# Patient Record
Sex: Female | Born: 1950 | Race: White | Hispanic: No | Marital: Single | Smoking: Former smoker
Health system: Southern US, Community
[De-identification: ages and names within clinical notes are randomized; demographics above are authoritative.]

## PROBLEM LIST (undated history)

## (undated) DIAGNOSIS — J45909 Unspecified asthma, uncomplicated: Secondary | ICD-10-CM

## (undated) DIAGNOSIS — F329 Major depressive disorder, single episode, unspecified: Secondary | ICD-10-CM

## (undated) DIAGNOSIS — L409 Psoriasis, unspecified: Secondary | ICD-10-CM

## (undated) DIAGNOSIS — E119 Type 2 diabetes mellitus without complications: Secondary | ICD-10-CM

## (undated) DIAGNOSIS — I10 Essential (primary) hypertension: Secondary | ICD-10-CM

## (undated) DIAGNOSIS — R519 Headache, unspecified: Secondary | ICD-10-CM

## (undated) DIAGNOSIS — K08199 Complete loss of teeth due to other specified cause, unspecified class: Secondary | ICD-10-CM

## (undated) DIAGNOSIS — R51 Headache: Secondary | ICD-10-CM

## (undated) DIAGNOSIS — I519 Heart disease, unspecified: Secondary | ICD-10-CM

## (undated) DIAGNOSIS — R32 Unspecified urinary incontinence: Secondary | ICD-10-CM

## (undated) DIAGNOSIS — E785 Hyperlipidemia, unspecified: Secondary | ICD-10-CM

## (undated) DIAGNOSIS — M1990 Unspecified osteoarthritis, unspecified site: Secondary | ICD-10-CM

## (undated) DIAGNOSIS — F4310 Post-traumatic stress disorder, unspecified: Secondary | ICD-10-CM

## (undated) DIAGNOSIS — F32A Depression, unspecified: Secondary | ICD-10-CM

## (undated) DIAGNOSIS — K633 Ulcer of intestine: Secondary | ICD-10-CM

## (undated) DIAGNOSIS — F410 Panic disorder [episodic paroxysmal anxiety] without agoraphobia: Secondary | ICD-10-CM

## (undated) DIAGNOSIS — M8580 Other specified disorders of bone density and structure, unspecified site: Secondary | ICD-10-CM

## (undated) DIAGNOSIS — J439 Emphysema, unspecified: Secondary | ICD-10-CM

## (undated) DIAGNOSIS — C801 Malignant (primary) neoplasm, unspecified: Secondary | ICD-10-CM

## (undated) HISTORY — DX: Headache, unspecified: R51.9

## (undated) HISTORY — PX: CHOLECYSTECTOMY: SHX55

## (undated) HISTORY — DX: Unspecified osteoarthritis, unspecified site: M19.90

## (undated) HISTORY — DX: Hyperlipidemia, unspecified: E78.5

## (undated) HISTORY — DX: Heart disease, unspecified: I51.9

## (undated) HISTORY — DX: Unspecified asthma, uncomplicated: J45.909

## (undated) HISTORY — DX: Malignant (primary) neoplasm, unspecified: C80.1

## (undated) HISTORY — DX: Complete loss of teeth due to other specified cause, unspecified class: K08.199

## (undated) HISTORY — DX: Headache: R51

## (undated) HISTORY — DX: Emphysema, unspecified: J43.9

## (undated) HISTORY — DX: Ulcer of intestine: K63.3

## (undated) HISTORY — DX: Other specified disorders of bone density and structure, unspecified site: M85.80

## (undated) HISTORY — DX: Major depressive disorder, single episode, unspecified: F32.9

## (undated) HISTORY — DX: Panic disorder (episodic paroxysmal anxiety): F41.0

## (undated) HISTORY — DX: Depression, unspecified: F32.A

## (undated) HISTORY — DX: Post-traumatic stress disorder, unspecified: F43.10

## (undated) HISTORY — DX: Psoriasis, unspecified: L40.9

## (undated) HISTORY — DX: Essential (primary) hypertension: I10

## (undated) HISTORY — DX: Type 2 diabetes mellitus without complications: E11.9

## (undated) HISTORY — DX: Unspecified urinary incontinence: R32

---

## 1973-01-20 HISTORY — PX: TUBAL LIGATION: SHX77

## 1998-01-20 HISTORY — PX: EYE SURGERY: SHX253

## 2010-01-20 HISTORY — PX: NECK SURGERY: SHX720

## 2014-01-20 HISTORY — PX: LEG SURGERY: SHX1003

## 2017-01-20 HISTORY — PX: CARDIAC SURGERY: SHX584

## 2017-02-18 ENCOUNTER — Encounter: Payer: Self-pay | Admitting: Family Medicine

## 2017-02-18 LAB — CBC AND DIFFERENTIAL
HCT: 36 (ref 36–46)
Hemoglobin: 11.3 — AB (ref 12.0–16.0)
Platelets: 487 — AB (ref 150–399)
WBC: 6.7

## 2017-03-18 LAB — BASIC METABOLIC PANEL
Creatinine: 1 (ref <=1.1)
Glucose: 158
Potassium: 5 (ref 3.4–5.3)
Sodium: 140 (ref 137–147)

## 2017-03-18 LAB — HEPATIC FUNCTION PANEL
ALT: 19 (ref 7–35)
AST: 20 (ref 13–35)
Alkaline Phosphatase: 108 (ref 25–125)

## 2017-04-30 ENCOUNTER — Encounter: Payer: Self-pay | Admitting: Family Medicine

## 2017-07-16 LAB — HM MAMMOGRAPHY

## 2017-11-24 ENCOUNTER — Ambulatory Visit: Payer: Self-pay | Admitting: Nurse Practitioner

## 2017-11-30 ENCOUNTER — Ambulatory Visit (INDEPENDENT_AMBULATORY_CARE_PROVIDER_SITE_OTHER): Payer: Medicare HMO | Admitting: Nurse Practitioner

## 2017-11-30 ENCOUNTER — Encounter: Payer: Self-pay | Admitting: Nurse Practitioner

## 2017-11-30 ENCOUNTER — Ambulatory Visit (INDEPENDENT_AMBULATORY_CARE_PROVIDER_SITE_OTHER): Payer: Medicare HMO

## 2017-11-30 VITALS — BP 114/68 | HR 75 | Temp 98.1°F | Ht 64.0 in | Wt 180.0 lb

## 2017-11-30 DIAGNOSIS — G8929 Other chronic pain: Secondary | ICD-10-CM | POA: Diagnosis not present

## 2017-11-30 DIAGNOSIS — M25572 Pain in left ankle and joints of left foot: Secondary | ICD-10-CM

## 2017-11-30 DIAGNOSIS — Z951 Presence of aortocoronary bypass graft: Secondary | ICD-10-CM

## 2017-11-30 DIAGNOSIS — R05 Cough: Secondary | ICD-10-CM

## 2017-11-30 DIAGNOSIS — R0602 Shortness of breath: Secondary | ICD-10-CM | POA: Diagnosis not present

## 2017-11-30 DIAGNOSIS — R059 Cough, unspecified: Secondary | ICD-10-CM

## 2017-11-30 DIAGNOSIS — R6889 Other general symptoms and signs: Secondary | ICD-10-CM | POA: Diagnosis not present

## 2017-11-30 MED ORDER — BENZONATATE 100 MG PO CAPS: 100.0000 mg | ORAL_CAPSULE | Freq: Three times a day (TID) | ORAL | 0 refills | Status: DC | PRN

## 2017-11-30 NOTE — Progress Notes (Signed)
Subjective:  Patient ID: Kara Williams, female    DOB: 1950/11/29  Age: 67 y.o. MRN: [id number]  CC: Establish Care (est care/pt is complaining of coughing--tried mucinex,inhaler,coughing med,allergy med and left foot/ankle pain. going on 3 wks. just moved here from [facility] 1 mo ago. )  unaccompanied. Moved [facility] to [place] 48month ago. Lives with daughter at this time. Previous followed by cardiology, pulmonology, pain clinic, pcp, and podiatry.  Cough  This is a new problem. The current episode started 1 to 4 weeks ago. The problem has been unchanged. The cough is non-productive. Associated symptoms include chest pain, nasal congestion, postnasal drip, shortness of breath and wheezing. Pertinent negatives include no chills, fever or sweats. The symptoms are aggravated by cold air. She has tried OTC cough suppressant and a beta-agonist inhaler for the symptoms. The treatment provided no relief. Her past medical history is significant for asthma, bronchitis and COPD.  Ankle Pain   The incident occurred more than 1 week ago. There was no injury mechanism. The pain is present in the left ankle and left heel. The quality of the pain is described as aching. The pain has been constant since onset. Pertinent negatives include no loss of motion, loss of sensation, muscle weakness, numbness or tingling. The symptoms are aggravated by weight bearing. She has tried rest, acetaminophen, NSAIDs and ice for the symptoms.   Hx of COPD:ASTHMA AND BRONCHITIS. TOBACCO USE FOR 27YR IN 1980s.  increased SOB and cough after contact with sick grandchild. Took cipro for 150mg  x 7days per patient. No edema, no PND.  Reports hx of heel spurs.  Reviewed past Medical, Social and Family history today.  Outpatient Medications Prior to Visit  Medication Sig Dispense Refill  . albuterol (PROVENTIL) (2.5 MG/3ML) 0.083% nebulizer solution Take 2.5 mg by nebulization every 6 (six) hours as needed for wheezing or shortness of  breath.    . ALPRAZolam (XANAX) 1 MG tablet Take 1 mg by mouth at bedtime as needed for anxiety.    Marland Kitchen aspirin EC 81 MG tablet Take 81 mg by mouth daily.    Marland Kitchen atorvastatin (LIPITOR) 80 MG tablet     . baclofen (LIORESAL) 10 MG tablet Take 10 mg by mouth 3 (three) times daily.    . Calcium Carb-Cholecalciferol (CALCIUM 1000 + D PO) Take by mouth.    . carvedilol (COREG) 6.25 MG tablet Take 6.25 mg by mouth every 12 (twelve) hours.  1  . Cholecalciferol (VITAMIN D3 PO) Take 1,000 Units by mouth.    . clopidogrel (PLAVIX) 75 MG tablet     . ENTRESTO 24-26 MG Take 1 tablet by mouth 2 (two) times daily.  2  . fluticasone (FLONASE) 50 MCG/ACT nasal spray     . HYDROcodone-acetaminophen (NORCO) 10-325 MG tablet     . Insulin Pen Needle (DROPLET PEN NEEDLES) 32G X 5 MM MISC by Does not apply route. 32 gauge x 5/32    . LANTUS SOLOSTAR 100 UNIT/ML Solostar Pen INJECT 25 UNITS SUBCUTANEOUSLY EVERY 12 HOURS  0  . MYRBETRIQ 25 MG TB24 tablet Take 25 mg by mouth daily.  0  . NOVOLOG FLEXPEN 100 UNIT/ML FlexPen USE AS DIRECTED PER SLIDING SCALE. MAX DAILY DOSE OF 30 UNITS PER DAY  0  . ondansetron (ZOFRAN) 4 MG tablet Take 4 mg by mouth every 8 (eight) hours as needed for nausea or vomiting.    Marland Kitchen PAZEO 0.7 % SOLN     . PENNSAID 2 % SOLN     .  potassium chloride SA (K-DUR,KLOR-CON) 20 MEQ tablet     . pregabalin (LYRICA) 150 MG capsule Take 150 mg by mouth 2 (two) times daily.    . ranitidine (ZANTAC) 150 MG tablet     . montelukast (SINGULAIR) 10 MG tablet     . SYMBICORT 160-4.5 MCG/ACT inhaler     . VENTOLIN HFA 108 (90 Base) MCG/ACT inhaler      No facility-administered medications prior to visit.     ROS See HPI  Objective:  BP 114/68   Pulse 75   Temp 98.1 F (36.7 C) (Oral)   Ht 5\' 4"  (1.626 m)   Wt 180 lb (81.6 kg)   SpO2 95%   BMI 30.90 kg/m   BP Readings from Last 3 Encounters:  11/30/17 114/68    Wt Readings from Last 3 Encounters:  11/30/17 180 lb (81.6 kg)    Physical  Exam  Constitutional: She is oriented to person, place, and time.  HENT:  Mouth/Throat: Oropharynx is clear and moist. No oropharyngeal exudate.  Neck: Normal range of motion. Neck supple. No thyromegaly present.  Cardiovascular: Normal rate and regular rhythm.  Pulmonary/Chest: Effort normal and breath sounds normal. No stridor. No respiratory distress. She has no wheezes. She has no rales.  Musculoskeletal: She exhibits tenderness. She exhibits no edema.       Left ankle: She exhibits normal range of motion, no swelling, no ecchymosis, no deformity and normal pulse. Tenderness. AITFL tenderness found. No lateral malleolus, no medial malleolus, no CF ligament, no posterior TFL, no head of 5th metatarsal and no proximal fibula tenderness found. Achilles tendon normal.  Lymphadenopathy:    She has no cervical adenopathy.  Neurological: She is alert and oriented to person, place, and time.  Skin: Skin is warm and dry.  Psychiatric: She has a normal mood and affect. Her behavior is normal. Thought content normal.  Vitals reviewed.   No results found for: WBC, HGB, HCT, PLT, GLUCOSE, CHOL, TRIG, HDL, LDLDIRECT, LDLCALC, ALT, AST, NA, K, CL, CREATININE, BUN, CO2, TSH, PSA, INR, GLUF, HGBA1C, MICROALBUR  Patient was never admitted.  Assessment & Plan:   Kara Williams was seen today for establish care.  Diagnoses and all orders for this visit:  Cough -     DG Chest 2 View -     benzonatate (TESSALON) 100 MG capsule; Take 1 capsule (100 mg total) by mouth 3 (three) times daily as needed for cough. -     VENTOLIN HFA 108 (90 Base) MCG/ACT inhaler; Inhale 1-2 puffs into the lungs every 6 (six) hours as needed for wheezing or shortness of breath. -     SYMBICORT 160-4.5 MCG/ACT inhaler; Inhale 2 puffs into the lungs 2 (two) times daily. Rinse mouth after each use -     montelukast (SINGULAIR) 10 MG tablet; Take 1 tablet (10 mg total) by mouth at bedtime.  SOB (shortness of breath) -     DG Chest 2  View -     VENTOLIN HFA 108 (90 Base) MCG/ACT inhaler; Inhale 1-2 puffs into the lungs every 6 (six) hours as needed for wheezing or shortness of breath. -     SYMBICORT 160-4.5 MCG/ACT inhaler; Inhale 2 puffs into the lungs 2 (two) times daily. Rinse mouth after each use  Chronic pain of left ankle -     Ambulatory referral to Podiatry  S/P CABG (coronary artery bypass graft) -     Ambulatory referral to Cardiology   I have changed Rodena Piety  Boteler's VENTOLIN HFA, SYMBICORT, and montelukast. I am also having her start on benzonatate. Additionally, I am having her maintain her atorvastatin, carvedilol, clopidogrel, fluticasone, HYDROcodone-acetaminophen, NOVOLOG FLEXPEN, LANTUS SOLOSTAR, PENNSAID, MYRBETRIQ, PAZEO, potassium chloride SA, ranitidine, ENTRESTO, albuterol, ALPRAZolam, aspirin EC, Calcium Carb-Cholecalciferol (CALCIUM 1000 + D PO), pregabalin, ondansetron, Cholecalciferol (VITAMIN D3 PO), Insulin Pen Needle, and baclofen.  Meds ordered this encounter  Medications  . benzonatate (TESSALON) 100 MG capsule    Sig: Take 1 capsule (100 mg total) by mouth 3 (three) times daily as needed for cough.    Dispense:  20 capsule    Refill:  0    Order Specific Question:   Supervising Provider    Answer:   Lucille Passy [[id number]]  . VENTOLIN HFA 108 (90 Base) MCG/ACT inhaler    Sig: Inhale 1-2 puffs into the lungs every 6 (six) hours as needed for wheezing or shortness of breath.    Dispense:  1 Inhaler    Refill:  1    Order Specific Question:   Supervising Provider    Answer:   MATTHEWS, CODY [[id number]]  . SYMBICORT 160-4.5 MCG/ACT inhaler    Sig: Inhale 2 puffs into the lungs 2 (two) times daily. Rinse mouth after each use    Dispense:  1 Inhaler    Refill:  1    Order Specific Question:   Supervising Provider    Answer:   MATTHEWS, CODY [[id number]]  . montelukast (SINGULAIR) 10 MG tablet    Sig: Take 1 tablet (10 mg total) by mouth at bedtime.    Dispense:  30 tablet    Refill:  0    Order  Specific Question:   Supervising Provider    Answer:   Lucille Passy [[id number]]    Follow-up: Return in about 3 days (around 12/03/2017) for medication reconciliation (30mins).  Wilfred Lacy, NP

## 2017-11-30 NOTE — Patient Instructions (Addendum)
Please sign medical release to get records from previous pcp, endocrinology and cardiology.  Will review records prior to making any additional recommendations  Bring medication bottle within this week to help verify medications and dosage.  CXR: no acute findings. Resume inhalers. Declined labs today.

## 2017-12-01 MED ORDER — VENTOLIN HFA 108 (90 BASE) MCG/ACT IN AERS: 1.0000 | INHALATION_SPRAY | Freq: Four times a day (QID) | RESPIRATORY_TRACT | 1 refills | Status: DC | PRN

## 2017-12-01 MED ORDER — SYMBICORT 160-4.5 MCG/ACT IN AERO: 2.0000 | INHALATION_SPRAY | Freq: Two times a day (BID) | RESPIRATORY_TRACT | 1 refills | Status: DC

## 2017-12-02 ENCOUNTER — Telehealth: Payer: Self-pay | Admitting: Nurse Practitioner

## 2017-12-02 ENCOUNTER — Encounter: Payer: Self-pay | Admitting: Nurse Practitioner

## 2017-12-02 MED ORDER — MONTELUKAST SODIUM 10 MG PO TABS: 10.0000 mg | ORAL_TABLET | Freq: Every day | ORAL | 0 refills | Status: DC

## 2017-12-02 NOTE — Telephone Encounter (Signed)
Sure. Let her know I will not px her norco long term and will need to get her set up with pain clinic here as wel.

## 2017-12-02 NOTE — Telephone Encounter (Signed)
Admin team please call the pt and offer an appt with new PCP (along with Dr. Rogers Blocker message).

## 2017-12-02 NOTE — Telephone Encounter (Signed)
Contact patient to schedule TOC appointment and per Dr. Rogers Blocker "Let her know she will not prescribe her norco long term and will need to get her set up with pain clinic as well." Document in this note once you speak to patient and note if she agrees to prescription agreement.

## 2017-12-02 NOTE — Telephone Encounter (Signed)
Both provider please advise.

## 2017-12-02 NOTE — Telephone Encounter (Signed)
Copied from [place] [id number]. Topic: General - Other >> Dec 02, 2017  1:03 PM Kara Williams, NT wrote: Reason for CRM: Patient needs to change from [place] to Dr Rogers Blocker for insurance purposes please call her to schedule at [phone]

## 2017-12-02 NOTE — Telephone Encounter (Signed)
ok 

## 2017-12-04 ENCOUNTER — Ambulatory Visit: Payer: Medicare HMO | Admitting: Nurse Practitioner

## 2017-12-05 DIAGNOSIS — J449 Chronic obstructive pulmonary disease, unspecified: Secondary | ICD-10-CM | POA: Insufficient documentation

## 2017-12-07 ENCOUNTER — Ambulatory Visit (INDEPENDENT_AMBULATORY_CARE_PROVIDER_SITE_OTHER): Payer: Medicare HMO

## 2017-12-07 ENCOUNTER — Encounter: Payer: Self-pay | Admitting: Podiatry

## 2017-12-07 ENCOUNTER — Other Ambulatory Visit: Payer: Self-pay | Admitting: Podiatry

## 2017-12-07 ENCOUNTER — Ambulatory Visit (INDEPENDENT_AMBULATORY_CARE_PROVIDER_SITE_OTHER): Payer: Medicare HMO | Admitting: Podiatry

## 2017-12-07 VITALS — BP 99/64 | HR 83 | Resp 16

## 2017-12-07 DIAGNOSIS — M779 Enthesopathy, unspecified: Secondary | ICD-10-CM

## 2017-12-07 DIAGNOSIS — R6889 Other general symptoms and signs: Secondary | ICD-10-CM | POA: Diagnosis not present

## 2017-12-07 DIAGNOSIS — M25572 Pain in left ankle and joints of left foot: Secondary | ICD-10-CM | POA: Diagnosis not present

## 2017-12-07 NOTE — Progress Notes (Signed)
   Subjective:    Patient ID: Kara Williams, female    DOB: 1950-04-04, 67 y.o.   MRN: [id number]  HPI    Review of Systems  Respiratory: Positive for shortness of breath.   All other systems reviewed and are negative.      Objective:   Physical Exam        Assessment & Plan:

## 2017-12-08 DIAGNOSIS — R6889 Other general symptoms and signs: Secondary | ICD-10-CM | POA: Diagnosis not present

## 2017-12-08 NOTE — Progress Notes (Signed)
Subjective:   Patient ID: Kara Williams, female   DOB: 67 y.o.   MRN: [id number]   HPI Patient presents stating she is having a lot of pain in the inside of her left ankle and its been hurting around a month.  Also states she is allergic to steroids and cannot take it is having trouble bearing weight on her foot.  Patient does not smoke currently and likes to be active and A1c has been around 7   Review of Systems  All other systems reviewed and are negative.       Objective:  Physical Exam  Constitutional: She appears well-developed and well-nourished.  Cardiovascular: Intact distal pulses.  Pulmonary/Chest: Effort normal.  Musculoskeletal: Normal range of motion.  Neurological: She is alert.  Skin: Skin is warm.  Nursing note and vitals reviewed.   Neurovascular status found to be intact with patient noted to have inflammation pain of the left medial ankle with moderate depression of the arch noted and patient was noted to have good digital perfusion and is well oriented x3     Assessment:  Acute posterior tibial tendinitis left with patient having inflammation and pain and inability to wear shoe gear     Plan:  H&P x-ray reviewed and today I did go ahead and I immobilized with air fracture walker to completely let it rest and she stated this gave her great relief.  I did discuss that ultimately this may require other treatment or MRI but hopefully it will respond to this conservative treatment  X-ray was negative for fracture or indications of bony arthritic condition

## 2017-12-10 ENCOUNTER — Encounter: Payer: Self-pay | Admitting: Family Medicine

## 2017-12-10 ENCOUNTER — Ambulatory Visit (INDEPENDENT_AMBULATORY_CARE_PROVIDER_SITE_OTHER): Payer: Medicare HMO | Admitting: Family Medicine

## 2017-12-10 VITALS — BP 124/82 | HR 76 | Temp 98.2°F | Ht 64.0 in | Wt 174.0 lb

## 2017-12-10 DIAGNOSIS — R05 Cough: Secondary | ICD-10-CM | POA: Diagnosis not present

## 2017-12-10 DIAGNOSIS — I10 Essential (primary) hypertension: Secondary | ICD-10-CM

## 2017-12-10 DIAGNOSIS — I509 Heart failure, unspecified: Secondary | ICD-10-CM | POA: Insufficient documentation

## 2017-12-10 DIAGNOSIS — I2510 Atherosclerotic heart disease of native coronary artery without angina pectoris: Secondary | ICD-10-CM | POA: Diagnosis not present

## 2017-12-10 DIAGNOSIS — I5022 Chronic systolic (congestive) heart failure: Secondary | ICD-10-CM | POA: Diagnosis not present

## 2017-12-10 DIAGNOSIS — F4310 Post-traumatic stress disorder, unspecified: Secondary | ICD-10-CM | POA: Diagnosis not present

## 2017-12-10 DIAGNOSIS — E119 Type 2 diabetes mellitus without complications: Secondary | ICD-10-CM

## 2017-12-10 DIAGNOSIS — R6889 Other general symptoms and signs: Secondary | ICD-10-CM | POA: Diagnosis not present

## 2017-12-10 DIAGNOSIS — M5030 Other cervical disc degeneration, unspecified cervical region: Secondary | ICD-10-CM

## 2017-12-10 DIAGNOSIS — R059 Cough, unspecified: Secondary | ICD-10-CM

## 2017-12-10 DIAGNOSIS — E785 Hyperlipidemia, unspecified: Secondary | ICD-10-CM | POA: Insufficient documentation

## 2017-12-10 MED ORDER — MONTELUKAST SODIUM 10 MG PO TABS: 10.0000 mg | ORAL_TABLET | Freq: Every day | ORAL | 0 refills | Status: DC

## 2017-12-10 MED ORDER — CARVEDILOL 6.25 MG PO TABS: 6.2500 mg | ORAL_TABLET | Freq: Two times a day (BID) | ORAL | 0 refills | Status: DC

## 2017-12-10 MED ORDER — ENTRESTO 24-26 MG PO TABS: 1.0000 | ORAL_TABLET | Freq: Two times a day (BID) | ORAL | 0 refills | Status: DC

## 2017-12-10 MED ORDER — ALPRAZOLAM 1 MG PO TABS: 1.0000 mg | ORAL_TABLET | Freq: Every evening | ORAL | 0 refills | Status: AC | PRN

## 2017-12-10 MED ORDER — POTASSIUM CHLORIDE CRYS ER 20 MEQ PO TBCR: 20.0000 meq | EXTENDED_RELEASE_TABLET | Freq: Every day | ORAL | 0 refills | Status: DC

## 2017-12-10 MED ORDER — ATORVASTATIN CALCIUM 80 MG PO TABS: 80.0000 mg | ORAL_TABLET | Freq: Every day | ORAL | 0 refills | Status: DC

## 2017-12-10 MED ORDER — SYMBICORT 160-4.5 MCG/ACT IN AERO: 2.0000 | INHALATION_SPRAY | Freq: Two times a day (BID) | RESPIRATORY_TRACT | 3 refills | Status: AC

## 2017-12-10 MED ORDER — MYRBETRIQ 25 MG PO TB24: 25.0000 mg | ORAL_TABLET | Freq: Every day | ORAL | 0 refills | Status: DC

## 2017-12-10 MED ORDER — CLOPIDOGREL BISULFATE 75 MG PO TABS: 75.0000 mg | ORAL_TABLET | Freq: Every day | ORAL | 0 refills | Status: DC

## 2017-12-10 MED ORDER — FLUTICASONE PROPIONATE 50 MCG/ACT NA SUSP: 1.0000 | Freq: Every day | NASAL | 3 refills | Status: DC

## 2017-12-10 MED ORDER — VENTOLIN HFA 108 (90 BASE) MCG/ACT IN AERS: 1.0000 | INHALATION_SPRAY | Freq: Four times a day (QID) | RESPIRATORY_TRACT | 1 refills | Status: DC | PRN

## 2017-12-10 MED ORDER — CALCIUM CARB-CHOLECALCIFEROL 1000-800 MG-UNIT PO TABS: 1.0000 | ORAL_TABLET | Freq: Two times a day (BID) | ORAL | 0 refills | Status: DC

## 2017-12-10 MED ORDER — PREGABALIN 150 MG PO CAPS: 150.0000 mg | ORAL_CAPSULE | Freq: Two times a day (BID) | ORAL | 1 refills | Status: DC

## 2017-12-10 MED ORDER — BACLOFEN 10 MG PO TABS: 10.0000 mg | ORAL_TABLET | Freq: Two times a day (BID) | ORAL | 0 refills | Status: DC

## 2017-12-10 NOTE — Progress Notes (Addendum)
Patient: Kara Williams MRN: [id number] DOB: September 06, 1950 PCP: Orma Flaming, MD     Subjective:  Chief Complaint  Patient presents with  . Coronary Artery Disease  . Hyperlipidemia  . Hypertension  . COPD  . Diabetes    HPI: The patient is a 67 y.o. female who presents today for establishing care. She has multiple co morbidities.   Hypertension: Here for follow up of hypertension.  Currently on entresto and coreg for CHF/CAD. Takes medication as prescribed and denies any side effects. Exercise includes nothing.. Weight has been stable. Denies any chest pain, headaches, shortness of breath, vision changes, swelling in lower extremities. She only has 2 pills left of her entresto.   Diabetes: Patient is here for follow up of type 2 diabetes. Currently on the following medications lantus and sliding scale insulin. Takes medications as prescribed. Not currently exercising and attempts to follow diabetic diet. Sugars range from 103  to 160. Denies any hypoglycemic events. Denies any vision changes, nausea, vomiting, abdominal pain, ulcers/paraesthesia in feet, polyuria, polydipsia or polyphagia. Denies any chest pain, shortness of breath. She tells me they stopped all oral medication when she had her MI and put her on insulin.   CAD: MI in 01/2017 in [place]. Cath---triple bypass. CHF secondary to MI. Currently on DPT with ASA/plavix, entresto, atrovastatin and coreg. Last echo was in June with EF of 39%. Needs to establish with cardiology here. Only has 2 pills of entresto left. Also requesting records so cardiology has last echo/cath/surgical report. No chest pain or anginal type symptoms. Takes medication as prescribed.   CHF: see above.   Degenerative disc disease: has hx of cervical issues with surgery and disc issues. Takes muscle relaxer daily with help for this and on narcotic (norco) followed by pain management in [place]. Needing a refill of her pain medication.   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 No other medication for this.   Review of Systems  Constitutional: Positive for fatigue. Negative for chills and fever.  HENT: Negative for congestion, dental problem, ear pain, hearing loss and trouble swallowing.   Eyes: Negative for visual disturbance.  Respiratory: Positive for cough (chronic ) and shortness of breath. Negative for chest tightness.   Cardiovascular: Negative for chest pain, palpitations and leg swelling.  Gastrointestinal: Negative for abdominal pain, blood in stool, diarrhea, nausea and vomiting.  Endocrine: Negative for cold intolerance, polydipsia, polyphagia and polyuria.  Genitourinary: Negative for dysuria and hematuria.  Musculoskeletal: Positive for back pain and neck pain. Negative for arthralgias.  Skin: Negative.  Negative for rash.  Neurological: Negative for dizziness, light-headedness and headaches.  Psychiatric/Behavioral: Positive for sleep disturbance. Negative for dysphoric mood. The patient is not nervous/anxious.     Allergies Patient is allergic to erythromycin; penicillins; sulfa antibiotics; lactose intolerance (gi); cortisone; and tetracyclines & related.  Past Medical History Patient  has a past medical history of Arthritis, Asthma, Cancer ([place]), Colon ulcer, Depression, Diabetes mellitus without complication ([place]), Emphysema of lung ([place]), Frequent headaches, Heart disease, Hyperlipidemia, Hypertension, Osteoarthritis, Osteopenia, Panic attack, Psoriasis, PTSD (post-traumatic stress disorder), and Urine incontinence.  Surgical History Patient  has a past surgical history that includes Tubal ligation (1975); Cholecystectomy; Neck surgery (2012); Leg Surgery (2016); Cardiac surgery (2019); and Eye surgery (2000).  Family History Pateint's family history includes Alcohol abuse in her father and mother; Arthritis in her father, mother, and sister; Asthma in her maternal grandfather, maternal  grandmother, paternal grandfather, paternal grandmother, and sister;  Bipolar disorder in her father, mother, and sister; COPD in her father, maternal grandfather, maternal grandmother, mother, paternal grandfather, paternal grandmother, and sister; Depression in her father, mother, and sister; Diabetes in her maternal grandfather, maternal grandmother, paternal grandfather, paternal grandmother, and sister; Drug abuse in her father and mother; Early death in her father and mother; Heart attack in her father, maternal grandfather, maternal grandmother, mother, paternal grandfather, paternal grandmother, and sister; Heart disease in her father, maternal grandfather, maternal grandmother, mother, paternal grandfather, paternal grandmother, and sister; Hyperlipidemia in her father, maternal grandfather, maternal grandmother, mother, paternal grandfather, paternal grandmother, and sister; Hypertension in her father, maternal grandfather, maternal grandmother, mother, paternal grandfather, paternal grandmother, and sister; Miscarriages / [place] in her mother.  Social History Patient  reports that she quit smoking about 39 years ago. She has never used smokeless tobacco. She reports that she does not drink alcohol or use drugs.    Objective: Vitals:   12/10/17 1311  BP: 124/82  Pulse: 76  Temp: 98.2 F (36.8 C)  TempSrc: Oral  SpO2: 96%  Weight: 174 lb (78.9 kg)  Height: 5\' 4"  (1.626 m)    Body mass index is 29.87 kg/m.  Physical Exam  Constitutional: She is oriented to person, place, and time. She appears well-developed and well-nourished.  HENT:  Right Ear: External ear normal.  Left Ear: External ear normal.  Mouth/Throat: Oropharynx is clear and moist.  Tm pearly with light reflex bilaterally    Eyes: Pupils are equal, round, and reactive to light. Conjunctivae and EOM are normal.  Neck: Normal range of motion. Neck supple. No thyromegaly present.  Cardiovascular: Normal rate,  regular rhythm, normal heart sounds and intact distal pulses.  No murmur heard. Pulmonary/Chest: Effort normal and breath sounds normal. She has no wheezes. She has no rales.  Abdominal: Soft. Bowel sounds are normal. She exhibits no distension. There is no tenderness.  Lymphadenopathy:    She has no cervical adenopathy.  Neurological: She is alert and oriented to person, place, and time. She displays normal reflexes. No cranial nerve deficit. Coordination normal.  Skin: Skin is warm and dry. Capillary refill takes less than 2 seconds. No rash noted.  Psychiatric: She has a normal mood and affect. Her behavior is normal.  Vitals reviewed.      Assessment/plan: 1. Benign essential HTN Blood pressure is to goal. Continue medication. Refills given. I need lab work today before giving all of her refills. She has PTSD with needles and can not do this today. She states she has to build up to get labs and likes to get to know people. Also will have to take a xanax prior to this. Discussed with her I have to get labs to continue refills. It is for her safety and if Im prescribing I need to make sure it's safe to give everything. She understands this and will be back within 4 weeks to do her labs.   2. Coronary artery disease involving native coronary artery of native heart without angina pectoris Requesting records of cardiology notes. Refilled entresto and all other drugs. Again, needs labs. Referred to cards.  - Ambulatory referral to Cardiology  3. Chronic systolic congestive heart failure ([place])  - Ambulatory referral to Cardiology  4. Degenerative disc disease, cervical Refilled baclofen. Will not do long term narcotics especially with px of xanax. Will refer to pain management.  - Ambulatory referral to Pain Clinic  5. PTSD (post-traumatic stress disorder) Will do drug contract at next visit.  Only uses about 3/month for blood draws. Sent in 10 pills for her.   6. Diabetes -all oral  medication stopped with MI. Likely due to acute CHF. Need a1c. No hypo events on insulin. Again, will come back and do labs in next month. No refills needed of her insulin. Will discuss with cards if we can change back to oral medication. Would benefit from jardiance.    **MUST HAVE LABS in ONE MONTH BEFORE REFILL OF MEDICATION   " I spent greater than 60 min face -to face with patient, and greater than 50% was spent counseling and/or coordinating care including getting records, reconciling all of her medications and refilling everything, discussing every complex medical issue which she has multiple of and referring to specialists.   Return in about 1 month (around 01/09/2018) for establish care part 2.     Orma Flaming, MD [place]  12/10/2017

## 2017-12-15 DIAGNOSIS — R6889 Other general symptoms and signs: Secondary | ICD-10-CM | POA: Diagnosis not present

## 2017-12-22 DIAGNOSIS — R6889 Other general symptoms and signs: Secondary | ICD-10-CM | POA: Diagnosis not present

## 2017-12-24 ENCOUNTER — Ambulatory Visit: Payer: Self-pay

## 2017-12-24 NOTE — Telephone Encounter (Signed)
Called and spoke with patient.  States that she has an appt scheduled tomorrow 12/6 at 2:20pm with Dr. Rogers Blocker.  Advised pt to continue symptomatic treatment until her appt tomorrow.  She verbalized understanding.

## 2017-12-24 NOTE — Telephone Encounter (Signed)
Pt c/o chills, runny nose, fatigue, fever to 101 this morning, fatigue, nausea and nonproductive cough. Symptoms started yesterday. Pt stated that her ribs are sore from coughing. Pt denies shortness of breath or wheezing. Pt has been taking regular strength Tylenol for the fever. Pt is tolerating fluids and is voiding.  Pt refused flu vaccine due to a needle phobia. Pt has not had any known flu exposure. Pt has many risk factors : COPD, triple bypass heart surgery in January, asthma and a diabetic. Pt stated that she has no transportation to come in for an OV until next Wednesday. Pt is asking for something to be called in to her pharmacy.  Called PCP office and updated [place]. [name] stated that pt could call a taxi to bring her back and forth from the office. Pt stated that she has no money for a taxi. Routing back to office high priority. Reason for Disposition . [1] Fever > 101 F (38.3 C) AND [2] age > 68  Answer Assessment - Initial Assessment Questions 1. WORST SYMPTOM: "What is your worst symptom?" (e.g., cough, runny nose, muscle aches, headache, sore throat, fever)      Chills and nausea, fever 101 this morning, fatigue, cough 2. ONSET: "When did your flu symptoms start?"      yesterday 3. COUGH: "How bad is the cough?"       nonproductive cough that has her ribs sore 4. RESPIRATORY DISTRESS: "Describe your breathing."      Using nebulizer and inhalers 5. FEVER: "Do you have a fever?" If so, ask: "What is your temperature, how was it measured, and when did it start?"     Yes- 101- by mouth 6. EXPOSURE: "Were you exposed to someone with influenza?"       no 7. FLU VACCINE: "Did you get a flu shot this year?"     no 8. HIGH RISK DISEASE: "Do you any chronic medical problems?" (e.g., heart or lung disease, asthma, weak immune system, or other HIGH RISK conditions)     COPD, triple heart bypass in January, asthma, diabetic 9. PREGNANCY: "Is there any chance you are pregnant?" "When was  your last menstrual period?"     n/a 10. OTHER SYMPTOMS: "Do you have any other symptoms?"  (e.g., runny nose, muscle aches, headache, sore throat)       Runny throat  Protocols used: INFLUENZA - SEASONAL-A-AH

## 2017-12-25 ENCOUNTER — Encounter: Payer: Self-pay | Admitting: Family Medicine

## 2017-12-25 ENCOUNTER — Telehealth: Payer: Self-pay | Admitting: Family Medicine

## 2017-12-25 ENCOUNTER — Ambulatory Visit (INDEPENDENT_AMBULATORY_CARE_PROVIDER_SITE_OTHER): Payer: Medicare HMO | Admitting: Family Medicine

## 2017-12-25 VITALS — BP 122/86 | HR 102 | Temp 98.0°F | Ht 64.0 in | Wt 170.4 lb

## 2017-12-25 DIAGNOSIS — R6889 Other general symptoms and signs: Secondary | ICD-10-CM

## 2017-12-25 DIAGNOSIS — R112 Nausea with vomiting, unspecified: Secondary | ICD-10-CM

## 2017-12-25 LAB — GLUCOSE, POCT (MANUAL RESULT ENTRY): POC GLUCOSE: 174 mg/dL — AB (ref 70–99)

## 2017-12-25 MED ORDER — ONDANSETRON 4 MG PO TBDP: 4.0000 mg | ORAL_TABLET | Freq: Three times a day (TID) | ORAL | 0 refills | Status: AC | PRN

## 2017-12-25 MED ORDER — OSELTAMIVIR PHOSPHATE 75 MG PO CAPS: 75.0000 mg | ORAL_CAPSULE | Freq: Two times a day (BID) | ORAL | 0 refills | Status: DC

## 2017-12-25 MED ORDER — GLUCOSE BLOOD VI STRP: ORAL_STRIP | 12 refills | Status: DC

## 2017-12-25 NOTE — Progress Notes (Signed)
Patient: Kara Williams MRN: [id number] DOB: [birth year] PCP: Orma Flaming, MD     Subjective:  Chief Complaint  Patient presents with  . flu like symptoms    x 3 days    HPI: The patient is a 67 y.o. female who presents today for fever, chills, cough, runny nose x 3 days. She is not achy, but her ribs hurt from coughing. She has taken corcedin HB, her inhalers and tylenol. She has only been able to keep down gingerale. She has been vomiting as well. She vomited all day for 2 days and hasn't vomited today. She feels a little bit better today, but not much. Her cough is dry in nature. Fever to 101 yesterday, no fever today. She has no sick contacts. She has some shortness of breath and wheezing. Her inhalers/nebs are helping and doesn't feel like COPD exacerbation. Took sugar pill before she came because felt like her blood sugar was low. Ran out of lancets so not checking her sugars.   Review of Systems  Constitutional: Positive for chills, diaphoresis, fatigue and fever.  HENT: Positive for ear pain, postnasal drip and rhinorrhea. Negative for sinus pressure and sinus pain.        Left ear feels full  Respiratory: Positive for cough and shortness of breath.   Cardiovascular: Positive for chest pain.       Chest hurts w/cough  Gastrointestinal: Positive for nausea and vomiting. Negative for abdominal pain, constipation and diarrhea.  Musculoskeletal: Positive for back pain and myalgias. Negative for neck pain.       Feels achey all over  Neurological: Negative for dizziness and headaches.  Psychiatric/Behavioral: Positive for sleep disturbance.    Allergies Patient is allergic to erythromycin; penicillins; sulfa antibiotics; lactose intolerance (gi); cortisone; and tetracyclines & related.  Past Medical History Patient  has a past medical history of Arthritis, Asthma, Cancer ([place]), Colon ulcer, Depression, Diabetes mellitus without complication ([place]), Emphysema of lung ([place]),  Frequent headaches, Heart disease, Hyperlipidemia, Hypertension, Osteoarthritis, Osteopenia, Panic attack, Psoriasis, PTSD (post-traumatic stress disorder), and Urine incontinence.  Surgical History Patient  has a past surgical history that includes Tubal ligation (1975); Cholecystectomy; Neck surgery (2012); Leg Surgery (2016); Cardiac surgery (2019); and Eye surgery (2000).  Family History Pateint's family history includes Alcohol abuse in her father and mother; Arthritis in her father, mother, and sister; Asthma in her maternal grandfather, maternal grandmother, paternal grandfather, paternal grandmother, and sister; Bipolar disorder in her father, mother, and sister; COPD in her father, maternal grandfather, maternal grandmother, mother, paternal grandfather, paternal grandmother, and sister; Depression in her father, mother, and sister; Diabetes in her maternal grandfather, maternal grandmother, paternal grandfather, paternal grandmother, and sister; Drug abuse in her father and mother; Early death in her father and mother; Heart attack in her father, maternal grandfather, maternal grandmother, mother, paternal grandfather, paternal grandmother, and sister; Heart disease in her father, maternal grandfather, maternal grandmother, mother, paternal grandfather, paternal grandmother, and sister; Hyperlipidemia in her father, maternal grandfather, maternal grandmother, mother, paternal grandfather, paternal grandmother, and sister; Hypertension in her father, maternal grandfather, maternal grandmother, mother, paternal grandfather, paternal grandmother, and sister; Miscarriages / [place] in her mother.  Social History Patient  reports that she quit smoking about 39 years ago. She has never used smokeless tobacco. She reports that she does not drink alcohol or use drugs.    Objective: Vitals:   12/25/17 1409  BP: 122/86  Pulse: (!) 102  Temp: 98 F (36.7 C)  TempSrc: Oral  SpO2:  97%  Weight:  170 lb 6.4 oz (77.3 kg)  Height: 5\' 4"  (1.626 m)    Body mass index is 29.25 kg/m.  Physical Exam  Constitutional: She appears well-developed and well-nourished.  HENT:  Right Ear: External ear normal.  Left Ear: External ear normal.  Mouth/Throat: Oropharynx is clear and moist. No oropharyngeal exudate.  Tm pearly with light reflex bilaterally  Mucous membranes moist   Neck: Normal range of motion. Neck supple. No thyromegaly present.  Cardiovascular: Normal rate, regular rhythm, normal heart sounds and intact distal pulses.  Pulmonary/Chest: Effort normal and breath sounds normal. She has no wheezes. She has no rales.  Abdominal: Soft. Bowel sounds are normal.  Lymphadenopathy:    She has no cervical adenopathy.  Skin: Capillary refill takes less than 2 seconds.  Some skin tenting  Vitals reviewed.     flu: negative  Glucose: 174  Assessment/plan:  1. Flu-like symptoms She is past 48 hours, but still feeling really bad. Will go ahead and do tamiflu even though flu test negative. She looks very flu like to me. Push fluids, zofran for nausea and discussed supportive therapy. Dehydration precautions given.   2. Intractable vomiting with nausea, unspecified vomiting type zofran prn. She has not thrown up today and is feeling slightly better. Continue to push fluids. Discussed I don't want her doing insulin as she is not eating. Can start doing insulin again after she starts eating again. Very strict dehydration precautions given. If still vomiting after 24 hours and unable to keep food down she is to go to ER. Vitals all stable here.  - POCT glucose (manual entry)     Return if symptoms worsen or fail to improve.   Orma Flaming, MD [place]   12/25/2017

## 2017-12-25 NOTE — Telephone Encounter (Signed)
Copied from [place] [id number]. Topic: Quick Communication - See Telephone Encounter >> Dec 25, 2017  4:13 PM Antonieta Iba C wrote: CRM for notification. See Telephone encounter for: 12/25/17.  Pt received a Rx for glucose blood test strip, pt's pharmacy is stating that they don't carry that type of test strips. Per pt, pharmacy is requesting to have a new meter and strips sent.

## 2017-12-28 ENCOUNTER — Other Ambulatory Visit: Payer: Self-pay

## 2017-12-28 ENCOUNTER — Encounter: Payer: Self-pay | Admitting: Family Medicine

## 2017-12-28 MED ORDER — BLOOD GLUCOSE METER KIT: PACK | 0 refills | Status: AC

## 2017-12-31 ENCOUNTER — Ambulatory Visit: Payer: Medicare HMO | Admitting: Cardiovascular Disease

## 2017-12-31 ENCOUNTER — Encounter: Payer: Self-pay | Admitting: Cardiovascular Disease

## 2017-12-31 ENCOUNTER — Ambulatory Visit (INDEPENDENT_AMBULATORY_CARE_PROVIDER_SITE_OTHER): Payer: Medicare HMO | Admitting: Cardiovascular Disease

## 2017-12-31 VITALS — BP 118/72 | HR 95 | Ht 64.0 in | Wt 169.0 lb

## 2017-12-31 DIAGNOSIS — R0602 Shortness of breath: Secondary | ICD-10-CM

## 2017-12-31 DIAGNOSIS — Z9889 Other specified postprocedural states: Secondary | ICD-10-CM | POA: Diagnosis not present

## 2017-12-31 DIAGNOSIS — I5042 Chronic combined systolic (congestive) and diastolic (congestive) heart failure: Secondary | ICD-10-CM

## 2017-12-31 DIAGNOSIS — E785 Hyperlipidemia, unspecified: Secondary | ICD-10-CM | POA: Diagnosis not present

## 2017-12-31 DIAGNOSIS — R7881 Bacteremia: Secondary | ICD-10-CM | POA: Diagnosis not present

## 2017-12-31 DIAGNOSIS — Z5181 Encounter for therapeutic drug level monitoring: Secondary | ICD-10-CM

## 2017-12-31 DIAGNOSIS — R6889 Other general symptoms and signs: Secondary | ICD-10-CM | POA: Diagnosis not present

## 2017-12-31 MED ORDER — ENTRESTO 24-26 MG PO TABS: 1.0000 | ORAL_TABLET | Freq: Two times a day (BID) | ORAL | 3 refills | Status: AC
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
## 2017-12-31 NOTE — Patient Instructions (Addendum)
Medication Instructions:  STOP CLOPIDOGREL IN 1 MONTH   If you need a refill on your cardiac medications before your next appointment, please call your pharmacy.   Lab work: FASTING LP/CMET/ESR/BNP AT YOUR PRIMARY CARE NEXT WEEK  If you have labs (blood work) drawn today and your tests are completely normal, you will receive your results only by: Marland Kitchen MyChart Message (if you have MyChart) OR . A paper copy in the mail If you have any lab test that is abnormal or we need to change your treatment, we will call you to review the results.  Testing/Procedures: Your physician has requested that you have an echocardiogram. Echocardiography is a painless test that uses sound waves to create images of your heart. It provides your doctor with information about the size and shape of your heart and how well your heart's chambers and valves are working. This procedure takes approximately one hour. There are no restrictions for this procedure. [address]  Follow-Up: At [facility], you and your health needs are our priority.  As part of our continuing mission to provide you with exceptional heart care, we have created designated Provider Care Teams.  These Care Teams include your primary Cardiologist (physician) and Advanced Practice Providers (APPs -  Physician Assistants and Nurse Practitioners) who all work together to provide you with the care you need, when you need it. You will need a follow up appointment in 5 months.  Please call our office 2 months in advance to schedule this appointment.  You may see DR [facility]  or one of the following Advanced Practice Providers on your designated Care Team:   Kerin Ransom, PA-C Roby Lofts, [place] . Sande Rives, PA-C  YOU HAVE BEEN REFERRED TO CARDIAC REHAB. PLEASE CALL THE OFFICE IF YOU DO NOT HEAR FROM THEM BY THE END OF NEXT WEEK   Any Other Special Instructions Will Be Listed Below (If Applicable).     Echocardiogram An echocardiogram, or echocardiography, uses sound waves (ultrasound) to produce an image of your heart. The echocardiogram is simple, painless, obtained within a short period of time, and offers valuable information to your health care provider. The images from an echocardiogram can provide information such as:  Evidence of coronary artery disease (CAD).  Heart size.  Heart muscle function.  Heart valve function.  Aneurysm detection.  Evidence of a past heart attack.  Fluid buildup around the heart.  Heart muscle thickening.  Assess heart valve function.  Tell a health care provider about:  Any allergies you have.  All medicines you are taking, including vitamins, herbs, eye drops, creams, and over-the-counter medicines.  Any problems you or family members have had with anesthetic medicines.  Any blood disorders you have.  Any surgeries you have had.  Any medical conditions you have.  Whether you are pregnant or may be pregnant. What happens before the procedure? No special preparation is needed. Eat and drink normally. What happens during the procedure?  In order to produce an image of your heart, gel will be applied to your chest and a wand-like tool (transducer) will be moved over your chest. The gel will help transmit the sound waves from the transducer. The sound waves will harmlessly bounce off your heart to allow the heart images to be captured in real-time motion. These images will then be recorded.  You may need an IV to receive a medicine that improves the quality of the pictures. What happens after the  procedure? You may return to your normal schedule including diet, activities, and medicines, unless your health care provider tells you otherwise. This information is not intended to replace advice given to you by your health care provider. Make sure you discuss any questions you have with your health care provider. Document Released: 01/04/2000  Document Revised: 08/25/2015 Document Reviewed: 09/13/2012 Elsevier Interactive Patient Education  2017 [employer].

## 2018-01-04 ENCOUNTER — Ambulatory Visit (INDEPENDENT_AMBULATORY_CARE_PROVIDER_SITE_OTHER): Payer: Medicare HMO | Admitting: Podiatry

## 2018-01-04 ENCOUNTER — Encounter: Payer: Self-pay | Admitting: Podiatry

## 2018-01-04 DIAGNOSIS — M779 Enthesopathy, unspecified: Secondary | ICD-10-CM | POA: Diagnosis not present

## 2018-01-04 DIAGNOSIS — R6889 Other general symptoms and signs: Secondary | ICD-10-CM | POA: Diagnosis not present

## 2018-01-04 DIAGNOSIS — M25572 Pain in left ankle and joints of left foot: Secondary | ICD-10-CM

## 2018-01-06 ENCOUNTER — Other Ambulatory Visit: Payer: Self-pay

## 2018-01-06 DIAGNOSIS — R05 Cough: Secondary | ICD-10-CM

## 2018-01-06 DIAGNOSIS — R059 Cough, unspecified: Secondary | ICD-10-CM

## 2018-01-06 MED ORDER — CALCIUM CARB-CHOLECALCIFEROL 1000-800 MG-UNIT PO TABS: 1.0000 | ORAL_TABLET | Freq: Two times a day (BID) | ORAL | 0 refills | Status: DC

## 2018-01-06 MED ORDER — BACLOFEN 10 MG PO TABS: 10.0000 mg | ORAL_TABLET | Freq: Two times a day (BID) | ORAL | 0 refills | Status: DC

## 2018-01-06 MED ORDER — MONTELUKAST SODIUM 10 MG PO TABS: 10.0000 mg | ORAL_TABLET | Freq: Every day | ORAL | 0 refills | Status: AC

## 2018-01-06 MED ORDER — POTASSIUM CHLORIDE CRYS ER 20 MEQ PO TBCR: 20.0000 meq | EXTENDED_RELEASE_TABLET | Freq: Every day | ORAL | 0 refills | Status: DC

## 2018-01-07 ENCOUNTER — Ambulatory Visit
Payer: Medicare HMO | Attending: Student in an Organized Health Care Education/Training Program | Admitting: Student in an Organized Health Care Education/Training Program

## 2018-01-07 ENCOUNTER — Other Ambulatory Visit: Payer: Self-pay

## 2018-01-07 ENCOUNTER — Encounter: Payer: Self-pay | Admitting: Student in an Organized Health Care Education/Training Program

## 2018-01-07 VITALS — BP 125/74 | HR 74 | Temp 97.7°F | Ht 64.0 in | Wt 169.0 lb

## 2018-01-07 DIAGNOSIS — M8580 Other specified disorders of bone density and structure, unspecified site: Secondary | ICD-10-CM | POA: Diagnosis not present

## 2018-01-07 DIAGNOSIS — Z79899 Other long term (current) drug therapy: Secondary | ICD-10-CM | POA: Insufficient documentation

## 2018-01-07 DIAGNOSIS — I2510 Atherosclerotic heart disease of native coronary artery without angina pectoris: Secondary | ICD-10-CM | POA: Diagnosis not present

## 2018-01-07 DIAGNOSIS — F4310 Post-traumatic stress disorder, unspecified: Secondary | ICD-10-CM | POA: Diagnosis not present

## 2018-01-07 DIAGNOSIS — Z882 Allergy status to sulfonamides status: Secondary | ICD-10-CM | POA: Insufficient documentation

## 2018-01-07 DIAGNOSIS — M797 Fibromyalgia: Secondary | ICD-10-CM | POA: Diagnosis not present

## 2018-01-07 DIAGNOSIS — I110 Hypertensive heart disease with heart failure: Secondary | ICD-10-CM | POA: Insufficient documentation

## 2018-01-07 DIAGNOSIS — M47817 Spondylosis without myelopathy or radiculopathy, lumbosacral region: Secondary | ICD-10-CM | POA: Diagnosis not present

## 2018-01-07 DIAGNOSIS — I509 Heart failure, unspecified: Secondary | ICD-10-CM | POA: Diagnosis not present

## 2018-01-07 DIAGNOSIS — Z951 Presence of aortocoronary bypass graft: Secondary | ICD-10-CM | POA: Diagnosis not present

## 2018-01-07 DIAGNOSIS — E785 Hyperlipidemia, unspecified: Secondary | ICD-10-CM | POA: Insufficient documentation

## 2018-01-07 DIAGNOSIS — Z794 Long term (current) use of insulin: Secondary | ICD-10-CM | POA: Insufficient documentation

## 2018-01-07 DIAGNOSIS — Z881 Allergy status to other antibiotic agents status: Secondary | ICD-10-CM | POA: Insufficient documentation

## 2018-01-07 DIAGNOSIS — M5136 Other intervertebral disc degeneration, lumbar region: Secondary | ICD-10-CM | POA: Diagnosis not present

## 2018-01-07 DIAGNOSIS — E119 Type 2 diabetes mellitus without complications: Secondary | ICD-10-CM | POA: Diagnosis not present

## 2018-01-07 DIAGNOSIS — Z7982 Long term (current) use of aspirin: Secondary | ICD-10-CM | POA: Insufficient documentation

## 2018-01-07 DIAGNOSIS — M549 Dorsalgia, unspecified: Secondary | ICD-10-CM | POA: Diagnosis not present

## 2018-01-07 DIAGNOSIS — Z880 Allergy status to penicillin: Secondary | ICD-10-CM | POA: Diagnosis not present

## 2018-01-07 DIAGNOSIS — Z87891 Personal history of nicotine dependence: Secondary | ICD-10-CM | POA: Insufficient documentation

## 2018-01-07 DIAGNOSIS — F429 Obsessive-compulsive disorder, unspecified: Secondary | ICD-10-CM | POA: Diagnosis not present

## 2018-01-07 DIAGNOSIS — J449 Chronic obstructive pulmonary disease, unspecified: Secondary | ICD-10-CM | POA: Insufficient documentation

## 2018-01-07 DIAGNOSIS — G894 Chronic pain syndrome: Secondary | ICD-10-CM | POA: Insufficient documentation

## 2018-01-07 DIAGNOSIS — F329 Major depressive disorder, single episode, unspecified: Secondary | ICD-10-CM | POA: Insufficient documentation

## 2018-01-07 DIAGNOSIS — F410 Panic disorder [episodic paroxysmal anxiety] without agoraphobia: Secondary | ICD-10-CM | POA: Diagnosis not present

## 2018-01-07 DIAGNOSIS — R6889 Other general symptoms and signs: Secondary | ICD-10-CM | POA: Diagnosis not present

## 2018-01-07 DIAGNOSIS — Z85118 Personal history of other malignant neoplasm of bronchus and lung: Secondary | ICD-10-CM | POA: Insufficient documentation

## 2018-01-07 NOTE — Progress Notes (Signed)
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 on the results of diagnostic studies, as well as Ms. Sebree's risk profile.   Membrane stabilizer: To be determined at a later time  Muscle relaxant: To be determined at a later time  NSAID: To be determined at a later time  Other analgesic(s): To be determined at a later time   Provider-requested follow-up: Return in about 3 weeks (around 01/28/2018) for Medication Management.  Future Appointments  Date Time Provider [place]  01/08/2018  2:20 PM Orma Flaming, MD [facility]  01/26/2018  2:00 PM [facility] ECHO 4 [id number]  02/02/2018 12:15 PM Gillis Santa, MD [facility] None    Primary Care Physician: Orma Flaming, MD Location: [facility] Note by: Gillis Santa, M.D, Date: 01/07/2018; Time: 3:07 PM  There are no Patient Instructions on file for this visit.

## 2018-01-08 ENCOUNTER — Encounter: Payer: Self-pay | Admitting: Family Medicine

## 2018-01-08 ENCOUNTER — Ambulatory Visit (INDEPENDENT_AMBULATORY_CARE_PROVIDER_SITE_OTHER): Payer: Medicare HMO | Admitting: Family Medicine

## 2018-01-08 VITALS — BP 116/82 | HR 97 | Temp 98.0°F | Ht 64.0 in | Wt 170.2 lb

## 2018-01-08 DIAGNOSIS — I2510 Atherosclerotic heart disease of native coronary artery without angina pectoris: Secondary | ICD-10-CM | POA: Diagnosis not present

## 2018-01-08 DIAGNOSIS — Z1159 Encounter for screening for other viral diseases: Secondary | ICD-10-CM

## 2018-01-08 DIAGNOSIS — I10 Essential (primary) hypertension: Secondary | ICD-10-CM

## 2018-01-08 DIAGNOSIS — I5022 Chronic systolic (congestive) heart failure: Secondary | ICD-10-CM | POA: Diagnosis not present

## 2018-01-08 DIAGNOSIS — J449 Chronic obstructive pulmonary disease, unspecified: Secondary | ICD-10-CM | POA: Diagnosis not present

## 2018-01-08 DIAGNOSIS — E119 Type 2 diabetes mellitus without complications: Secondary | ICD-10-CM | POA: Diagnosis not present

## 2018-01-08 DIAGNOSIS — R6889 Other general symptoms and signs: Secondary | ICD-10-CM | POA: Diagnosis not present

## 2018-01-08 LAB — CBC WITH DIFFERENTIAL/PLATELET
Basophils Absolute: 0.1 10*3/uL (ref 0.0–0.1)
Basophils Relative: 0.6 % (ref 0.0–3.0)
Eosinophils Absolute: 0.1 10*3/uL (ref 0.0–0.7)
Eosinophils Relative: 0.8 % (ref 0.0–5.0)
HCT: 42.1 % (ref 36.0–46.0)
Hemoglobin: 14 g/dL (ref 12.0–15.0)
Lymphocytes Relative: 14.8 % (ref 12.0–46.0)
Lymphs Abs: 1.8 10*3/uL (ref 0.7–4.0)
MCHC: 33.2 g/dL (ref 30.0–36.0)
MCV: 86.1 fl (ref 78.0–100.0)
MONOS PCT: 6.7 % (ref 3.0–12.0)
Monocytes Absolute: 0.8 10*3/uL (ref 0.1–1.0)
Neutro Abs: 9.1 10*3/uL — ABNORMAL HIGH (ref 1.4–7.7)
Neutrophils Relative %: 77.1 % — ABNORMAL HIGH (ref 43.0–77.0)
Platelets: 242 10*3/uL (ref 150.0–400.0)
RBC: 4.89 Mil/uL (ref 3.87–5.11)
RDW: 14.5 % (ref 11.5–15.5)
WBC: 11.9 10*3/uL — ABNORMAL HIGH (ref 4.0–10.5)

## 2018-01-08 LAB — MICROALBUMIN / CREATININE URINE RATIO
Creatinine,U: 382.5 mg/dL
MICROALB/CREAT RATIO: 1.9 mg/g (ref 0.0–30.0)
Microalb, Ur: 7.3 mg/dL — ABNORMAL HIGH (ref 0.0–1.9)

## 2018-01-08 LAB — COMPREHENSIVE METABOLIC PANEL
ALT: 13 U/L (ref 0–35)
AST: 12 U/L (ref 0–37)
Albumin: 4.2 g/dL (ref 3.5–5.2)
Alkaline Phosphatase: 64 U/L (ref 39–117)
BUN: 18 mg/dL (ref 6–23)
CO2: 24 mEq/L (ref 19–32)
Calcium: 9.5 mg/dL (ref 8.4–10.5)
Chloride: 102 mEq/L (ref 96–112)
Creatinine, Ser: 1.13 mg/dL (ref 0.40–1.20)
GFR: 51.03 mL/min — AB (ref >60.00)
Glucose, Bld: 189 mg/dL — ABNORMAL HIGH (ref 70–99)
Potassium: 4.1 mEq/L (ref 3.5–5.1)
Sodium: 136 mEq/L (ref 135–145)
Total Bilirubin: 1 mg/dL (ref 0.2–1.2)
Total Protein: 6.9 g/dL (ref 6.0–8.3)

## 2018-01-08 LAB — SEDIMENTATION RATE: Sed Rate: 25 mm/hr (ref 0–30)

## 2018-01-08 LAB — LIPID PANEL
Cholesterol: 120 mg/dL (ref 0–200)
HDL: 50.2 mg/dL (ref >39.00)
LDL Cholesterol: 50 mg/dL (ref 0–99)
NonHDL: 69.82
Total CHOL/HDL Ratio: 2
Triglycerides: 98 mg/dL (ref 0.0–149.0)
VLDL: 19.6 mg/dL (ref 0.0–40.0)

## 2018-01-08 LAB — HEMOGLOBIN A1C: Hgb A1c MFr Bld: 8 % — ABNORMAL HIGH (ref 4.6–6.5)

## 2018-01-08 LAB — TSH: TSH: 0.7 u[IU]/mL (ref 0.35–4.50)

## 2018-01-08 LAB — VITAMIN D 25 HYDROXY (VIT D DEFICIENCY, FRACTURES): VITD: 22.96 ng/mL — ABNORMAL LOW (ref 30.00–100.00)

## 2018-01-08 LAB — BRAIN NATRIURETIC PEPTIDE: Pro B Natriuretic peptide (BNP): 87 pg/mL (ref 0.0–100.0)

## 2018-01-08 MED ORDER — METFORMIN HCL 1000 MG PO TABS: 1000.0000 mg | ORAL_TABLET | Freq: Two times a day (BID) | ORAL | 3 refills | Status: AC

## 2018-01-08 MED ORDER — PREDNISONE 20 MG PO TABS: 40.0000 mg | ORAL_TABLET | Freq: Every day | ORAL | 0 refills | Status: AC

## 2018-01-08 MED ORDER — EMPAGLIFLOZIN 25 MG PO TABS: 25.0000 mg | ORAL_TABLET | Freq: Every day | ORAL | 3 refills | Status: DC

## 2018-01-08 NOTE — Addendum Note (Signed)
Addended by: Kayren Eaves T on: 01/08/2018 03:25 PM   Modules accepted: Orders

## 2018-01-08 NOTE — Patient Instructions (Signed)
For your diabetes... Continue insulin until done with 5 days of steroids then we will start below...   1) decrease insulin down to 10 units at night only 2) start jardiance 10mg  daily. After done with 10mg  samples, start the 25mg  samples and prescription I sent in is for 25mg  daily 3) start metformin twice a day. I sent in 1000mg  pills, but you are going to start with 1/2 pill twice a day for 3-4 days then 1 pill in the AM and 1/2 pill in the pm for 3-4 days then increase to full dose of 1 pill twice a day.   -if fasting blood sugars continue to be above goal (100-110) after we get oral meds on board you can increase your insulin by 1-2 units every 3-4 days. Do not want you going over 15 units.   Will see you back in 3 months with labs, so take medication!   Merry christmas!

## 2018-01-08 NOTE — Progress Notes (Signed)
Patient: Cecila Satcher MRN: [id number] DOB: 1951-01-08 PCP: Orma Flaming, MD     Subjective:  Chief Complaint  Patient presents with  . med f/u    HPI: The patient is a 67 y.o. female who presents today for meds/diabetes and lab work today.   Diabetes: Patient is here for follow up of type 2 diabetes. First diagnosed 2014 .  Currently on the following medications lantus 25mg  BID. No oral medications.  Takes medications as prescribed. Last A1C was in the 7's per patient. Currently exercising and following diabetic diet. Sugars range from 103 to 110. Denies any hypoglycemic events. Denies any vision changes, nausea, vomiting, abdominal pain, ulcers/paraesthesia in feet, polyuria, polydipsia or polyphagia. Denies any chest pain, shortness of breath. She was never on insulin before her heart attack and then after this they stopped all oral and put her on insulin. She was on metformin before and tolerated fine. She also has novolog sliding scale, but she never needs to use this.   CAD/CHF: seen by cards. Ordering labs and will forward.   Cough: She has had this since she moved to [facility] from [place]. She then got what appeared to be the flu a few weeks ago and it got worse, but it's now dry and has occasional production of clear mucous. She is short of breath and wheezy at night. She has her albuterol neb, symbicort and Singulair. She has COPD, but does not have increased sputum, just more short of breath. No fever/chills.   Review of Systems  Constitutional: Positive for appetite change and fatigue.       Feels full after only eating a very small meal,  Only eats 1 meal per day  HENT: Positive for ear pain.        Left ear feels like she has fluid in her ear  Respiratory: Positive for cough and shortness of breath.   Cardiovascular: Positive for chest pain.       L area of chest is very tender since having surgery in 1/19  Gastrointestinal: Negative for abdominal pain and nausea.   Musculoskeletal: Positive for back pain and neck pain.  Neurological: Positive for headaches. Negative for dizziness.    Allergies Patient is allergic to erythromycin; penicillins; sulfa antibiotics; lactose intolerance (gi); cortisone; and tetracyclines & related.  Past Medical History Patient  has a past medical history of Arthritis, Asthma, Cancer ([place]), Colon ulcer, Depression, Diabetes mellitus without complication ([place]), Emphysema of lung ([place]), Frequent headaches, Heart disease, Hyperlipidemia, Hypertension, Osteoarthritis, Osteopenia, Panic attack, Psoriasis, PTSD (post-traumatic stress disorder), and Urine incontinence.  Surgical History Patient  has a past surgical history that includes Tubal ligation (1975); Cholecystectomy; Neck surgery (2012); Leg Surgery (2016); Cardiac surgery (2019); and Eye surgery (2000).  Family History Pateint's family history includes Alcohol abuse in her father and mother; Arthritis in her father, mother, and sister; Asthma in her maternal grandfather, maternal grandmother, paternal grandfather, paternal grandmother, and sister; Bipolar disorder in her father, mother, and sister; COPD in her father, maternal grandfather, maternal grandmother, mother, paternal grandfather, paternal grandmother, and sister; Depression in her father, mother, and sister; Diabetes in her maternal grandfather, maternal grandmother, paternal grandfather, paternal grandmother, and sister; Drug abuse in her father and mother; Early death in her father and mother; Heart attack in her father, maternal grandfather, maternal grandmother, mother, paternal grandfather, paternal grandmother, and sister; Heart disease in her father, maternal grandfather, maternal grandmother, mother, paternal grandfather, paternal grandmother, and sister; Hyperlipidemia in her father, maternal grandfather, maternal grandmother,  mother, paternal grandfather, paternal grandmother, and sister; Hypertension in her  father, maternal grandfather, maternal grandmother, mother, paternal grandfather, paternal grandmother, and sister; Miscarriages / [place] in her mother.  Social History Patient  reports that she quit smoking about 39 years ago. She has never used smokeless tobacco. She reports that she does not drink alcohol or use drugs.    Objective: Vitals:   01/08/18 1414  BP: 116/82  Pulse: 97  Temp: 98 F (36.7 C)  TempSrc: Oral  SpO2: 98%  Weight: 170 lb 3.2 oz (77.2 kg)  Height: 5\' 4"  (1.626 m)    Body mass index is 29.21 kg/m.  Physical Exam Vitals signs reviewed.  Constitutional:      Appearance: Normal appearance.  HENT:     Right Ear: Tympanic membrane, ear canal and external ear normal.     Left Ear: Tympanic membrane, ear canal and external ear normal.     Nose: Nose normal. No congestion.  Neck:     Musculoskeletal: Normal range of motion and neck supple.  Cardiovascular:     Rate and Rhythm: Normal rate and regular rhythm.     Heart sounds: Normal heart sounds.  Pulmonary:     Effort: Pulmonary effort is normal. No respiratory distress.     Breath sounds: Normal breath sounds. No wheezing or rales.  Abdominal:     General: Abdomen is flat. Bowel sounds are normal.     Palpations: Abdomen is soft.  Lymphadenopathy:     Cervical: No cervical adenopathy.  Skin:    General: Skin is warm.  Neurological:     General: No focal deficit present.     Mental Status: She is alert and oriented to person, place, and time.  Psychiatric:        Mood and Affect: Mood normal.        Behavior: Behavior normal.        Assessment/plan: 1. Diabetes mellitus without complication ([place]) We are going to add on oral medication. Staring her back on metformin and will titrate her up to 1000mg  bid. Instructions given for this. Also adding on jardiance with her cardiovascular risks. Samples given and will have her start at 10mg  and then titrate up to 25mg . im going ot decrease her  insulin down to 10 units QHS only. She can titrate this up as needed by 1-2 units until to goal BS. Do not want her going over 15 units. Hypoglycemic precautions given. Side effects of both medications dicussed and checking labs for renal function today. See her back in 3 months or as needed.  - VITAMIN D 25 Hydroxy (Vit-D Deficiency, Fractures) - TSH - Hemoglobin A1c - Microalbumin / creatinine urine ratio  2. Benign essential HTN Blood pressure is to goal. Continue current anti-hypertensive medications. Routine lab work will be done today. Recommended routine exercise and healthy diet including DASH diet and mediterranean diet. Encouraged weight loss. F/u in 6 months.   - CBC with Differential/Platelet - Comprehensive metabolic panel - Lipid panel  3. Coronary artery disease involving native coronary artery of native heart without angina pectoris Ordering labs per cardiology. Is now followed by dr. Oval Linsey. Stopping plavix in January. Continue all other medication.  - Sedimentation rate  4. Chronic systolic congestive heart failure (HCC) Upcoming echo, bnp today.  - Brain natriuretic peptide  5. Chronic obstructive pulmonary disease, unspecified COPD type ([place]) Do not think she is having an exacerbation. No increased sputum, no increased work of breathing, no fever. She is  clear on exam. Will do steroid burst, but hold off on antibiotics at this time. Also having echo done as well to make sure no cardiac issues contributing. She is euvolemic and stable from a chf standpoint. She has known bad allergies which could be contributing. Continue inhalers and precautions given.    Return in about 3 months (around 04/09/2018) for diabetes/labs .   Orma Flaming, MD [place]   01/08/2018

## 2018-01-09 NOTE — Progress Notes (Signed)
Subjective:   Patient ID: Kara Williams, female   DOB: 67 y.o.   MRN: [id number]   HPI Patient presents stating she is improving quite well with continued mild discomfort but overall much better with her heel   ROS      Objective:  Physical Exam  Neurovascular status intact with patient found to have posterior heel pain left that improving doing much better and only painful with direct pressure     Assessment:  Patient is improving from Achilles tendinitis left     Plan:  H&P spent a great deal time educating her on condition the long-term results and stretching exercises shoe gear modifications heel lift therapy.  Patient will be seen back as needed

## 2018-01-10 LAB — HEPATITIS C ANTIBODY
Hepatitis C Ab: NONREACTIVE
SIGNAL TO CUT-OFF: 0.01 (ref <1.00)

## 2018-01-11 ENCOUNTER — Encounter: Payer: Self-pay | Admitting: Family Medicine

## 2018-01-11 ENCOUNTER — Telehealth (HOSPITAL_COMMUNITY): Payer: Self-pay | Admitting: *Deleted

## 2018-01-11 ENCOUNTER — Other Ambulatory Visit: Payer: Self-pay | Admitting: Family Medicine

## 2018-01-11 ENCOUNTER — Telehealth (HOSPITAL_COMMUNITY): Payer: Self-pay

## 2018-01-11 MED ORDER — LEVOFLOXACIN 500 MG PO TABS: 500.0000 mg | ORAL_TABLET | Freq: Every day | ORAL | 0 refills | Status: DC

## 2018-01-11 NOTE — Telephone Encounter (Signed)
Pt insurance is active and benefits verified through [employer]. Co-pay $0.00, DED $183.00/$183.00 met, out of pocket $6,700.00/$0.00 met, co-insurance 20%. No pre-authorization required. Lisa/[employer], 01/11/18 @ 12:42pm, REF# [id number]  Pts 2ndary insurance is active through [place]. Ref# [id number]  Will fax over [facility] Reimbursement form to [place]

## 2018-01-11 NOTE — Telephone Encounter (Signed)
Attempted to call patient in regards to Cardiac Rehab - LM on VM 

## 2018-01-11 NOTE — Telephone Encounter (Signed)
Received referral for pt to participate in cardiac rehab from Dr. Oval Linsey.  Called and left message with pt and pt daughter to contact for scheduling. Contact information provided. Cherre Huger, BSN Cardiac and Training and development officer

## 2018-01-12 LAB — COMPLIANCE DRUG ANALYSIS, UR

## 2018-01-18 ENCOUNTER — Encounter: Payer: Self-pay | Admitting: Family Medicine

## 2018-01-19 ENCOUNTER — Encounter (HOSPITAL_COMMUNITY): Payer: Self-pay

## 2018-01-19 ENCOUNTER — Telehealth (HOSPITAL_COMMUNITY): Payer: Self-pay

## 2018-01-19 NOTE — Telephone Encounter (Signed)
Attempted to call pt a 2nd time- LM ON VM ° °Mailed letter out °

## 2018-01-21 ENCOUNTER — Telehealth: Payer: Self-pay | Admitting: Family Medicine

## 2018-01-21 ENCOUNTER — Other Ambulatory Visit: Payer: Self-pay | Admitting: Family Medicine

## 2018-01-21 ENCOUNTER — Encounter: Payer: Self-pay | Admitting: Family Medicine

## 2018-01-21 NOTE — Telephone Encounter (Signed)
FYI: Spoke to pt and she is not able to come in at 1:20pm today. Pt stated that she will need a 72 hour notice due to not being able to get around. Pt also stated that her breathing has gotten a lot better. Pt was advised to check her O2 while on the phone. Pt verbalized understanding and stated that it was at 88. Pt was advised that she may need to be seen or go to an emergency room to be evaluated. Pt was informed that when O2 drop below 90 that it is serious. Pt will call back if sx worsen. Pt was also advised that I will call back later to check status.

## 2018-01-21 NOTE — Telephone Encounter (Signed)
Copied from [place] [id number]. Topic: General - Other >> Jan 21, 2018  2:45 PM Yvette Rack wrote: Reason for CRM: pt spoke with Tillie Rung stating that her oxygen is now 16

## 2018-01-22 ENCOUNTER — Other Ambulatory Visit: Payer: Self-pay

## 2018-01-22 ENCOUNTER — Telehealth: Payer: Self-pay

## 2018-01-22 MED ORDER — PANTOPRAZOLE SODIUM 40 MG PO TBEC: 40.0000 mg | DELAYED_RELEASE_TABLET | Freq: Every day | ORAL | 3 refills | Status: DC

## 2018-01-22 NOTE — Telephone Encounter (Signed)
Called and spoke with patient to follow up on last phone call when patient had a low O2 level and was symptomatic with cough and SOB.  Her O2 level while on th phone with me was 99%.  She states that she feels much better and cough/SOB is almost gone.  Doesn't feel the need to come in for an appt.

## 2018-01-22 NOTE — Telephone Encounter (Signed)
Already spoke with patient and she is feeling much better-O2 level at 99% today.

## 2018-01-24 ENCOUNTER — Emergency Department (HOSPITAL_COMMUNITY): Payer: Medicare HMO

## 2018-01-24 ENCOUNTER — Encounter: Payer: Self-pay | Admitting: Emergency Medicine

## 2018-01-24 ENCOUNTER — Emergency Department (HOSPITAL_COMMUNITY)
Admission: EM | Admit: 2018-01-24 | Discharge: 2018-01-24 | Disposition: A | Discharge status: Home / Self Care | Payer: Medicare HMO | Attending: Emergency Medicine | Admitting: Emergency Medicine

## 2018-01-24 DIAGNOSIS — Z79899 Other long term (current) drug therapy: Secondary | ICD-10-CM | POA: Diagnosis not present

## 2018-01-24 DIAGNOSIS — S8991XA Unspecified injury of right lower leg, initial encounter: Secondary | ICD-10-CM | POA: Diagnosis not present

## 2018-01-24 DIAGNOSIS — E119 Type 2 diabetes mellitus without complications: Secondary | ICD-10-CM | POA: Insufficient documentation

## 2018-01-24 DIAGNOSIS — W19XXXA Unspecified fall, initial encounter: Secondary | ICD-10-CM

## 2018-01-24 DIAGNOSIS — Z794 Long term (current) use of insulin: Secondary | ICD-10-CM | POA: Diagnosis not present

## 2018-01-24 DIAGNOSIS — M25561 Pain in right knee: Secondary | ICD-10-CM

## 2018-01-24 DIAGNOSIS — S79911A Unspecified injury of right hip, initial encounter: Secondary | ICD-10-CM | POA: Diagnosis not present

## 2018-01-24 DIAGNOSIS — I10 Essential (primary) hypertension: Secondary | ICD-10-CM | POA: Insufficient documentation

## 2018-01-24 DIAGNOSIS — Z87891 Personal history of nicotine dependence: Secondary | ICD-10-CM | POA: Insufficient documentation

## 2018-01-24 DIAGNOSIS — M25551 Pain in right hip: Secondary | ICD-10-CM | POA: Diagnosis not present

## 2018-01-24 DIAGNOSIS — Z7982 Long term (current) use of aspirin: Secondary | ICD-10-CM | POA: Diagnosis not present

## 2018-01-24 MED ORDER — HYDROCODONE-ACETAMINOPHEN 5-325 MG PO TABS: 1.0000 | ORAL_TABLET | Freq: Four times a day (QID) | ORAL | 0 refills | Status: DC | PRN

## 2018-01-24 MED ORDER — HYDROCODONE-ACETAMINOPHEN 5-325 MG PO TABS
2.0000 | ORAL_TABLET | Freq: Once | ORAL | Status: AC
  Administered 2018-01-24: 2 via ORAL
  Filled 2018-01-24: qty 2

## 2018-01-24 NOTE — ED Triage Notes (Signed)
Pt presents with c/o right knee and leg pain from a fall last night. Pt reports the pain is from her right hip to her right knee and she is not able to put weight on the knee.

## 2018-01-24 NOTE — ED Provider Notes (Signed)
[facility] DEPT Provider Note   CSN: [id number] Arrival date & time: 01/24/18  1637  History   Chief Complaint Chief Complaint  Patient presents with  . Knee Pain  . Hip Pain    HPI Kara Williams is a 68 y.o. female with medical history significant for DM, chronic pain, hypertension, hyperlipidemia CHF, MI, COPD who presents for evaluation of knee and hip pain.  Patient states she was walking yesterday evening when she slipped in mud.  States she did not fall, however her knee and hip pulled to the right.  Patient states she has had pain which begins in her knee and radiates up to her hip since the incident.  Rates her pain a 9/10 with ambulation, however states her pain is a 5/10 when she is not ambulating.  Patient states she has had difficulty with ambulation secondary to pain.  Patient states she does have a history of meniscus and ACL tears which have not been surgically repaired.  She walks with a cane at baseline.  States she does see management for chronic pain.  She recently moved here from [place].  Was seen early in December at pain management, however states he has not "refilled on my prescriptions."  She states pain is located to lateral medial right knee and radiates up into her leg when she ambulates.  Denies fever, chills, numbness, tingling in her extremities, low back pain, hitting head, LOC.  History obtained from patient.  No interpreter was used.  HPI  Past Medical History:  Diagnosis Date  . Arthritis   . Asthma   . Cancer (HCC)    Skin cancer/ basel cell benign   . Colon ulcer   . Depression   . Diabetes mellitus without complication ([place])   . Emphysema of lung ([place])   . Frequent headaches   . Heart disease   . Hyperlipidemia   . Hypertension   . Osteoarthritis   . Osteopenia   . Panic attack   . Psoriasis   . PTSD (post-traumatic stress disorder)    Needle Phobia  . Urine incontinence     Patient Active Problem List   Diagnosis Date Noted  . Benign essential HTN 12/10/2017  . CAD (coronary artery disease) 12/10/2017  . Diabetes mellitus without complication ([place]) 15/17/6160  . Hyperlipidemia 12/10/2017  . CHF (congestive heart failure) ([place]) 12/10/2017  . Degenerative disc disease, cervical 12/10/2017  . PTSD (post-traumatic stress disorder) 12/10/2017  . COPD (chronic obstructive pulmonary disease) ([place]) 12/05/2017    Past Surgical History:  Procedure Laterality Date  . CARDIAC SURGERY  2019   Triple bypass  . CHOLECYSTECTOMY    . EYE SURGERY  2000  . LEG SURGERY  2016  . NECK SURGERY  2012  . TUBAL LIGATION  1975     OB History   No obstetric history on file.      Home Medications    Prior to Admission medications   Medication Sig Start Date End Date Taking? Authorizing Provider  albuterol (PROVENTIL) (2.5 MG/3ML) 0.083% nebulizer solution Take 2.5 mg by nebulization every 6 (six) hours as needed for wheezing or shortness of breath.    [provider]  ALPRAZolam Duanne Moron) 1 MG tablet Take 1 tablet (1 mg total) by mouth at bedtime as needed for anxiety. 12/10/17   Orma Flaming, MD  Ascorbic Acid (VITAMIN C) 1000 MG tablet Take 1,000 mg by mouth daily.    [provider]  aspirin EC 81 MG  tablet Take 81 mg by mouth daily.    [provider]  atorvastatin (LIPITOR) 80 MG tablet Take 1 tablet (80 mg total) by mouth at bedtime. 12/10/17   Orma Flaming, MD  baclofen (LIORESAL) 10 MG tablet Take 1 tablet (10 mg total) by mouth 2 (two) times daily. 01/06/18   Orma Flaming, MD  benzonatate (TESSALON) 100 MG capsule Take 1 capsule (100 mg total) by mouth 3 (three) times daily as needed for cough. 11/30/17   Nche, Charlene Brooke, NP  blood glucose meter kit and supplies Dispense based on patient and insurance preference. Use up to four times daily as directed. (FOR ICD-10 E10.9, E11.9). 12/28/17   Orma Flaming, MD  Calcium Carb-Cholecalciferol (CALCIUM 1000 + D)  1000-800 MG-UNIT TABS Take 1 tablet by mouth 2 (two) times daily. 01/06/18   Orma Flaming, MD  carvedilol (COREG) 6.25 MG tablet Take 1 tablet (6.25 mg total) by mouth every 12 (twelve) hours. 12/10/17   Orma Flaming, MD  Cholecalciferol (VITAMIN D3 PO) Take 1,000 Units by mouth.    [provider]  clopidogrel (PLAVIX) 75 MG tablet Take 1 tablet (75 mg total) by mouth daily. 12/10/17   Orma Flaming, MD  DM-APAP-CPM (CORICIDIN HBP FLU PO) Take by mouth.    [provider]  empagliflozin (JARDIANCE) 25 MG TABS tablet Take 25 mg by mouth daily. 01/08/18   Orma Flaming, MD  ENTRESTO 24-26 MG Take 1 tablet by mouth 2 (two) times daily. 12/31/17   Skeet Latch, MD  fluticasone [facility]) 50 MCG/ACT nasal spray Place 1 spray into both nostrils daily. 12/10/17   Orma Flaming, MD  glucose blood test strip USE UP TO 4 TIMES DAILY AS DIRECTED 01/21/18   Orma Flaming, MD  HYDROcodone-acetaminophen [facility]) 10-325 MG tablet  09/07/17   [provider]  Insulin Pen Needle (DROPLET PEN NEEDLES) 32G X 5 MM MISC by Does not apply route. 32 gauge x 5/32    [provider]  LANTUS SOLOSTAR 100 UNIT/ML Solostar Pen INJECT 25 UNITS SUBCUTANEOUSLY EVERY 12 HOURS 10/22/17   [provider]  levofloxacin (LEVAQUIN) 500 MG tablet Take 1 tablet (500 mg total) by mouth daily. 01/11/18   Orma Flaming, MD  metFORMIN (GLUCOPHAGE) 1000 MG tablet Take 1 tablet (1,000 mg total) by mouth 2 (two) times daily with a meal. 01/08/18   Orma Flaming, MD  montelukast (SINGULAIR) 10 MG tablet Take 1 tablet (10 mg total) by mouth at bedtime. 01/06/18   Orma Flaming, MD  MYRBETRIQ 25 MG TB24 tablet Take 1 tablet (25 mg total) by mouth daily. 12/10/17   Orma Flaming, MD  ondansetron (ZOFRAN ODT) 4 MG disintegrating tablet Take 1 tablet (4 mg total) by mouth every 8 (eight) hours as needed for nausea or vomiting. 12/25/17   Orma Flaming, MD  ondansetron (ZOFRAN) 4 MG tablet Take  4 mg by mouth every 8 (eight) hours as needed for nausea or vomiting.    [provider]  pantoprazole (PROTONIX) 40 MG tablet Take 1 tablet (40 mg total) by mouth daily. 01/22/18   Orma Flaming, MD  potassium chloride SA (K-DUR,KLOR-CON) 20 MEQ tablet Take 1 tablet (20 mEq total) by mouth daily. 01/06/18   Orma Flaming, MD  pregabalin (LYRICA) 150 MG capsule Take 1 capsule (150 mg total) by mouth 2 (two) times daily. 12/10/17   Orma Flaming, MD  ranitidine (ZANTAC) 150 MG tablet  09/15/17   [provider]  SYMBICORT 160-4.5 MCG/ACT inhaler Inhale 2 puffs into  the lungs 2 (two) times daily. Rinse mouth after each use 12/10/17   Orma Flaming, MD  VENTOLIN HFA 108 [phone] Base) MCG/ACT inhaler Inhale 1-2 puffs into the lungs every 6 (six) hours as needed for wheezing or shortness of breath. 12/10/17   Orma Flaming, MD    Family History Family History  Problem Relation Age of Onset  . Alcohol abuse Mother   . Arthritis Mother   . COPD Mother   . Depression Mother   . Drug abuse Mother   . Early death Mother   . Heart attack Mother   . Heart disease Mother   . Hyperlipidemia Mother   . Hypertension Mother   . Bipolar disorder Mother   . Miscarriages / [place] Mother   . Alcohol abuse Father   . Arthritis Father   . COPD Father   . Depression Father   . Drug abuse Father   . Early death Father   . Heart disease Father   . Hyperlipidemia Father   . Hypertension Father   . Heart attack Father   . Bipolar disorder Father   . Arthritis Sister   . Asthma Sister   . Depression Sister   . COPD Sister   . Diabetes Sister   . Heart attack Sister   . Heart disease Sister   . Hyperlipidemia Sister   . Hypertension Sister   . Bipolar disorder Sister   . Asthma Maternal Grandmother   . COPD Maternal Grandmother   . Diabetes Maternal Grandmother   . Hyperlipidemia Maternal Grandmother   . Heart disease Maternal Grandmother   . Hypertension Maternal Grandmother    . Heart attack Maternal Grandmother   . Asthma Maternal Grandfather   . COPD Maternal Grandfather   . Diabetes Maternal Grandfather   . Heart disease Maternal Grandfather   . Hyperlipidemia Maternal Grandfather   . Hypertension Maternal Grandfather   . Heart attack Maternal Grandfather   . Asthma Paternal Grandmother   . COPD Paternal Grandmother   . Diabetes Paternal Grandmother   . Heart disease Paternal Grandmother   . Hypertension Paternal Grandmother   . Hyperlipidemia Paternal Grandmother   . Heart attack Paternal Grandmother   . Asthma Paternal Grandfather   . COPD Paternal Grandfather   . Diabetes Paternal Grandfather   . Heart disease Paternal Grandfather   . Hyperlipidemia Paternal Grandfather   . Hypertension Paternal Grandfather   . Heart attack Paternal Grandfather     Social History Social History   Tobacco Use  . Smoking status: Former Smoker    Last attempt to quit: 01/20/1978    Years since quitting: 40.0  . Smokeless tobacco: Never Used  Substance Use Topics  . Alcohol use: Never    Frequency: Never  . Drug use: Never     Allergies   Erythromycin; Penicillins; Sulfa antibiotics; Lactose intolerance (gi); Cortisone; and Tetracyclines & related   Review of Systems Review of Systems  Constitutional: Negative.  Negative for chills and fever.  HENT: Negative.   Eyes: Negative.   Respiratory: Negative.   Cardiovascular: Negative.  Negative for leg swelling.  Gastrointestinal: Negative.  Negative for nausea and vomiting.  Musculoskeletal: Positive for gait problem. Negative for arthralgias and back pain.       Right knee and right hip pain.  Skin: Negative for color change and rash.  Neurological: Negative for dizziness, facial asymmetry, weakness, light-headedness, numbness and headaches.  All other systems reviewed and are negative.    Physical Exam  Updated Vital Signs BP 115/73 (BP Location: Left Arm)   Pulse 70   Temp 97.8 F (36.6 C)  (Oral)   Resp 16   LMP  (LMP Unknown)   SpO2 98%   Physical Exam Vitals signs and nursing note reviewed.  Constitutional:      General: She is not in acute distress.    Appearance: She is well-developed. She is not ill-appearing, toxic-appearing or diaphoretic.     Comments: Patient sitting in bed comfortably watching TV drinking water initial evaluation.  She does not appear in any distress.  HENT:     Head: Atraumatic.     Nose: Nose normal. No congestion or rhinorrhea.     Mouth/Throat:     Mouth: Mucous membranes are moist.     Pharynx: Oropharynx is clear. No oropharyngeal exudate or posterior oropharyngeal erythema.  Eyes:     Pupils: Pupils are equal, round, and reactive to light.  Neck:     Musculoskeletal: Normal range of motion.  Cardiovascular:     Rate and Rhythm: Normal rate.     Pulses: Normal pulses.     Heart sounds: Normal heart sounds. No murmur. No friction rub. No gallop.   Pulmonary:     Effort: No respiratory distress.     Comments: Clear to auscultation bilaterally without wheeze, rhonchi or rales. Abdominal:     General: There is no distension.     Comments: Soft, Nontender without rebound or guarding.  Musculoskeletal: Normal range of motion.     Comments: Full range of motion bilateral lower extremities.  Able to flex and extend right lower extremity at knee.  Full plantar flexion dorsiflexion at right ankle.  Able to straight leg raise bilateral knees.  Tenderness palpation over medial and lateral meniscus.  Tenderness with varus and valgus stress.  Negative anterior drawer.  Patient states it is painful to ambulate.  No tenderness over right hip or pelvis.  Skin:    General: Skin is warm and dry.     Comments: No edema, erythema, ecchymosis or warmth.  Neurological:     Mental Status: She is alert.     Comments: 5/5 strength bilateral lower extremities.  Intact sensation to sharp and dull.      ED Treatments / Results  Labs (all labs ordered  are listed, but only abnormal results are displayed) Labs Reviewed - No data to display  EKG None  Radiology Dg Knee Complete 4 Views Right  Result Date: 01/24/2018 CLINICAL DATA:  RIGHT hip pain radiating to knee.  Fell last night. EXAM: RIGHT KNEE - COMPLETE 4+ VIEW COMPARISON:  None. FINDINGS: No evidence of fracture, dislocation, or joint effusion. Osteopenia. No advanced arthropathy or other focal bone abnormality. Soft tissues are nonacute, vascular clips and lower knee soft tissues. IMPRESSION: 1. No acute osseous process or advanced degenerative change. Electronically Signed   By: Elon Alas M.D.   On: 01/24/2018 17:33   Dg Hip Unilat  With Pelvis 2-3 Views Right  Result Date: 01/24/2018 CLINICAL DATA:  RIGHT hip pain radiating to knee, fell last night. EXAM: DG HIP (WITH OR WITHOUT PELVIS) 2-3V RIGHT COMPARISON:  None. FINDINGS: There is no evidence of hip fracture or dislocation. There is no evidence of arthropathy or other focal bone abnormality. Phleboliths project in the pelvis. Tiny metallic clip projects RIGHT inguinal soft tissues. IMPRESSION: Negative. Electronically Signed   By: Elon Alas M.D.   On: 01/24/2018 17:34    Procedures Procedures (including critical care  time)  Medications Ordered in ED Medications  HYDROcodone-acetaminophen (NORCO/VICODIN) 5-325 MG per tablet 2 tablet (2 tablets Oral Given 01/24/18 1846)     Initial Impression / Assessment and Plan / ED Course  I have reviewed the triage vital signs and the nursing notes.  Pertinent labs & imaging results that were available during my care of the patient were reviewed by me and considered in my medical decision making (see chart for details).  68 year old female who appears otherwise well presents for evaluation after mechanical fall.  Patient states she did not completely fall however she slipped on mud and extended her right leg out to the side.  Has had pain to her night knee and right hip  since incident.  Of note, patient does have a history of chronic pain.  She recently moved here from [place] in September and has not received all of her chronic pain medications.  Patient has been able to ambulate, however states she has pain that radiates from her knee to her hip with ambulation.  She has no pain at rest.  She has a normal musculoskeletal exam, however states it is painful to flex at the right knee.  No shortening or rotation of legs.  Tenderness with varus and valgus stress.  Negative anterior drawer.  Patient does have history of known bilateral ACL tears and meniscal tears.  These were not surgically repaired.  She does walk with a cane at baseline.  Able to ambulate in department.  Low suspicion for occult fracture, septic joint, gout, hemarthrosis. Pain film x-ray right knee and right hip negative for fracture or dislocation.  There is no edema, erythema, ecchymosis or warmth to bilateral lower extremities.  She is neurovascularly intact.  Able to straight leg raise without difficulty.  Low suspicion for patellar tendon rupture.  2+ DP, PT pulses.  Low suspicion for acute emergent pathology causing patient's symptoms at this time.  Likely acute on chronic knee pain.  Placed in splint, pain management and have patient follow-up with PCP and pain management of her chronic pain.  Discussed follow-up with orthopedics if she continues to have pain.  Patient is hemodynamically stable and appropriate for DC home at this time.  I discussed return precautions.  Patient voiced understanding and is agreeable for follow-up.  Patient has been personally seen and evaluated by attending Dr. Vanita Panda.  He agrees with above treatment, plan disposition.    Final Clinical Impressions(s) / ED Diagnoses   Final diagnoses:  Fall, initial encounter  Acute pain of right knee  Right hip pain    ED Discharge Orders    None       Henderly, Britni A, PA-C 01/24/18 2034    Carmin Muskrat,  MD 01/25/18 [phone]

## 2018-01-24 NOTE — Discharge Instructions (Signed)
Evaluated today for right hip and right knee pain.  Your x-ray was negative for fracture dislocation.  This most likely musculoskeletal strain or sprain.  I have provided you with a brace for your leg.  Please follow-up with your PCP as well as pain management for your chronic pain.  Return to the ED for any worsening symptoms.

## 2018-01-26 ENCOUNTER — Ambulatory Visit (HOSPITAL_COMMUNITY): Payer: Medicare HMO | Attending: Cardiology

## 2018-01-26 ENCOUNTER — Other Ambulatory Visit: Payer: Self-pay

## 2018-01-26 DIAGNOSIS — R0602 Shortness of breath: Secondary | ICD-10-CM | POA: Diagnosis not present

## 2018-01-26 DIAGNOSIS — R6889 Other general symptoms and signs: Secondary | ICD-10-CM | POA: Diagnosis not present

## 2018-01-26 DIAGNOSIS — Z9889 Other specified postprocedural states: Secondary | ICD-10-CM | POA: Insufficient documentation

## 2018-01-27 DIAGNOSIS — R6889 Other general symptoms and signs: Secondary | ICD-10-CM | POA: Diagnosis not present

## 2018-01-28 ENCOUNTER — Telehealth: Payer: Self-pay

## 2018-01-28 ENCOUNTER — Telehealth: Payer: Self-pay | Admitting: Cardiovascular Disease

## 2018-01-28 ENCOUNTER — Ambulatory Visit: Payer: Medicare HMO | Admitting: Family Medicine

## 2018-01-28 ENCOUNTER — Other Ambulatory Visit: Payer: Self-pay

## 2018-01-28 DIAGNOSIS — R9430 Abnormal result of cardiovascular function study, unspecified: Secondary | ICD-10-CM

## 2018-01-28 DIAGNOSIS — Z9889 Other specified postprocedural states: Secondary | ICD-10-CM

## 2018-01-28 NOTE — Telephone Encounter (Signed)
Called and left detailed message on patient's cell phone.  We really don't need to see pt for a follow up visit today for s/p ED visit.  Pt has difficulty obtaining transportation to get to her appts.  She is followed by pain management for her pain medication.  Advised that we could simply place referral for an MRI of her right knee and she doesn't need to be seen here unless another specific issue needs to be addressed.

## 2018-01-28 NOTE — Telephone Encounter (Signed)
New Message    Patient calling in to get results of Echo done 01/26/18.

## 2018-01-28 NOTE — Telephone Encounter (Signed)
Pt aware of echo results and Dr.Badger's recommendation.  Notes recorded by Skeet Latch, MD on 01/26/2018 at 6:01 PM EST The echo shows that her heart muscle is a little bit weaker than her previous report. We will need to repeat her echo in 3 months after she has been on Entresto.  Pt sts that she has been on Entresto 24/26mg  bid since July 2019. Adv pt that I would fwd a message to [place] for clarification and we will call back with her response. Pt verbalizes understanding.

## 2018-01-29 ENCOUNTER — Other Ambulatory Visit: Payer: Self-pay | Admitting: Family Medicine

## 2018-01-29 NOTE — Telephone Encounter (Signed)
Discussed with Dr Oval Linsey, patient needs to see EP

## 2018-01-29 NOTE — Telephone Encounter (Signed)
Returned call to patient. She was not calling our office in regards to refill on lancets but was following up on referral to EP. Explained that Rip Harbour spoke with her within the hour and told her that a referral has been placed and the EP scheduler will reach out to her. Reiterated this.

## 2018-01-29 NOTE — Telephone Encounter (Signed)
Appointment 02/12/2018

## 2018-01-29 NOTE — Telephone Encounter (Signed)
Follow up:    Patient returning call back. Please call patient back at [phone]

## 2018-01-29 NOTE — Telephone Encounter (Signed)
Advised patient, referral placed, and message sent to [facility] in scheduling

## 2018-01-29 NOTE — Telephone Encounter (Signed)
Left message to call back  

## 2018-01-31 ENCOUNTER — Other Ambulatory Visit: Payer: Self-pay | Admitting: Family Medicine

## 2018-02-01 ENCOUNTER — Encounter: Payer: Self-pay | Admitting: *Deleted

## 2018-02-01 ENCOUNTER — Encounter: Payer: Self-pay | Admitting: Family Medicine

## 2018-02-01 ENCOUNTER — Telehealth: Payer: Self-pay | Admitting: *Deleted

## 2018-02-01 NOTE — Telephone Encounter (Signed)
Patient sent a mychart message regarding cardiac rehab and if she should postpone given her EF remains at 30-35%. Her EP consult is scheduled for 02/12/2018. Will forward to Dr Oval Linsey for review

## 2018-02-01 NOTE — Telephone Encounter (Signed)
She should still do cardiac rehab.

## 2018-02-02 ENCOUNTER — Other Ambulatory Visit: Payer: Self-pay

## 2018-02-02 ENCOUNTER — Encounter: Payer: Self-pay | Admitting: Student in an Organized Health Care Education/Training Program

## 2018-02-02 ENCOUNTER — Other Ambulatory Visit: Payer: Self-pay | Admitting: Family Medicine

## 2018-02-02 ENCOUNTER — Ambulatory Visit
Payer: Medicare HMO | Attending: Student in an Organized Health Care Education/Training Program | Admitting: Student in an Organized Health Care Education/Training Program

## 2018-02-02 ENCOUNTER — Other Ambulatory Visit: Payer: Self-pay | Admitting: Student in an Organized Health Care Education/Training Program

## 2018-02-02 VITALS — BP 93/76 | HR 84 | Temp 97.9°F | Resp 16 | Ht 64.0 in | Wt 170.0 lb

## 2018-02-02 DIAGNOSIS — Z79899 Other long term (current) drug therapy: Secondary | ICD-10-CM | POA: Insufficient documentation

## 2018-02-02 DIAGNOSIS — M5136 Other intervertebral disc degeneration, lumbar region: Secondary | ICD-10-CM

## 2018-02-02 DIAGNOSIS — G894 Chronic pain syndrome: Secondary | ICD-10-CM | POA: Diagnosis not present

## 2018-02-02 DIAGNOSIS — M47817 Spondylosis without myelopathy or radiculopathy, lumbosacral region: Secondary | ICD-10-CM | POA: Diagnosis not present

## 2018-02-02 DIAGNOSIS — M549 Dorsalgia, unspecified: Secondary | ICD-10-CM | POA: Diagnosis not present

## 2018-02-02 DIAGNOSIS — M797 Fibromyalgia: Secondary | ICD-10-CM | POA: Insufficient documentation

## 2018-02-02 DIAGNOSIS — Z951 Presence of aortocoronary bypass graft: Secondary | ICD-10-CM | POA: Diagnosis not present

## 2018-02-02 DIAGNOSIS — R6889 Other general symptoms and signs: Secondary | ICD-10-CM | POA: Diagnosis not present

## 2018-02-02 MED ORDER — PREGABALIN 150 MG PO CAPS: 150.0000 mg | ORAL_CAPSULE | Freq: Two times a day (BID) | ORAL | 2 refills | Status: DC

## 2018-02-02 MED ORDER — HYDROCODONE-ACETAMINOPHEN 10-325 MG PO TABS: 1.0000 | ORAL_TABLET | Freq: Three times a day (TID) | ORAL | 0 refills | Status: DC | PRN

## 2018-02-02 MED ORDER — BACLOFEN 10 MG PO TABS: 10.0000 mg | ORAL_TABLET | Freq: Two times a day (BID) | ORAL | 2 refills | Status: DC

## 2018-02-02 MED ORDER — DICLOFENAC SODIUM 2 % TD SOLN: 0.5000 g | Freq: Two times a day (BID) | TRANSDERMAL | 2 refills | Status: DC | PRN

## 2018-02-02 NOTE — Progress Notes (Signed)
Nursing Pain Medication Assessment:  Safety precautions to be maintained throughout the outpatient stay will include: orient to surroundings, keep bed in low position, maintain call bell within reach at all times, provide assistance with transfer out of bed and ambulation.  Medication Inspection Compliance: Kara Williams did not comply with our request to bring her pills to be counted. She was reminded that bringing the medication bottles, even when empty, is a requirement.  Medication: None brought in. Pill/Patch Count: None available to be counted. Bottle Appearance: No container available. Did not bring bottle(s) to appointment. Filled Date: N/A Last Medication intake:  Today   Received Hydrocodone 5/325mg  during ED visit after a fall resulting in knee injury.

## 2018-02-02 NOTE — Patient Instructions (Signed)
1.  Sign opioid contract 2.  New prescription of hydrocodone for 2 months. 3.  Follow-up with Dionisio David for medication management.
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
## 2018-02-02 NOTE — Telephone Encounter (Signed)
Advised patient and number for cardiac rehab given.

## 2018-02-03 ENCOUNTER — Telehealth (HOSPITAL_COMMUNITY): Payer: Self-pay

## 2018-02-03 NOTE — Telephone Encounter (Signed)
Pt returned CR phone call, adv pt due to the date of her event and her insurance they will not cover CR. Offer pt our maintenance program, pt stated she is not able to afford the maintenance program and will f/u with her doctor.  Closed referral

## 2018-02-04 ENCOUNTER — Telehealth: Payer: Self-pay | Admitting: *Deleted

## 2018-02-04 ENCOUNTER — Encounter: Payer: Self-pay | Admitting: Family Medicine

## 2018-02-04 NOTE — Telephone Encounter (Signed)
Below [employer] received from patient. Will forward to Dr Oval Linsey so she will be aware    Talked to [place] yesterday and was told I could not go to [facility] because the time of 6 months had passed for me to be able to go. She also said if I did decide to go I would have to pay for it out of pocket. That I can not do. Just wanted to let you know. I did get the info you sent me today. Thank you for all your help.

## 2018-02-04 NOTE — Addendum Note (Signed)
Addended by: Alvina Filbert B on: 02/04/2018 04:10 PM   Modules accepted: Level of Service, SmartSet

## 2018-02-04 NOTE — Telephone Encounter (Signed)
Error

## 2018-02-04 NOTE — Telephone Encounter (Signed)
This encounter was created in error - please disregard.

## 2018-02-05 NOTE — Telephone Encounter (Signed)
That is unfortunate.  Thanks for letting me know.

## 2018-02-10 ENCOUNTER — Telehealth: Payer: Self-pay | Admitting: Nurse Practitioner

## 2018-02-10 ENCOUNTER — Telehealth: Payer: Self-pay

## 2018-02-10 NOTE — Telephone Encounter (Signed)
Called pt. To inquire which medication need PA. Pennsaid needs PA

## 2018-02-10 NOTE — Telephone Encounter (Signed)
Pt left a voicemail stating that her medication needs PA

## 2018-02-10 NOTE — Telephone Encounter (Signed)
Called pt to inquire which medication needed a PA. She states her NSAID

## 2018-02-11 DIAGNOSIS — R6889 Other general symptoms and signs: Secondary | ICD-10-CM | POA: Diagnosis not present

## 2018-02-11 NOTE — Telephone Encounter (Signed)
PA needs to be done

## 2018-02-12 ENCOUNTER — Encounter: Payer: Self-pay | Admitting: Cardiology

## 2018-02-12 ENCOUNTER — Ambulatory Visit (INDEPENDENT_AMBULATORY_CARE_PROVIDER_SITE_OTHER): Payer: Medicare HMO | Admitting: Cardiology

## 2018-02-12 VITALS — BP 124/62 | HR 75 | Ht 64.0 in | Wt 171.0 lb

## 2018-02-12 DIAGNOSIS — I255 Ischemic cardiomyopathy: Secondary | ICD-10-CM | POA: Diagnosis not present

## 2018-02-12 DIAGNOSIS — R6889 Other general symptoms and signs: Secondary | ICD-10-CM | POA: Diagnosis not present

## 2018-02-12 DIAGNOSIS — I5022 Chronic systolic (congestive) heart failure: Secondary | ICD-10-CM

## 2018-02-12 NOTE — Patient Instructions (Signed)
Medication Instructions:  Your physician recommends that you continue on your current medications as directed. Please refer to the Current Medication list given to you today.  * If you need a refill on your cardiac medications before your next appointment, please call your pharmacy.   Labwork: None ordered  Testing/Procedures: Your physician has recommended that you have a defibrillator inserted. An implantable cardioverter defibrillator (ICD) is a small device that is placed in your chest or, in rare cases, your abdomen. This device uses electrical pulses or shocks to help control life-threatening, irregular heartbeats that could lead the heart to suddenly stop beating (sudden cardiac arrest). Leads are attached to the ICD that goes into your heart. This is done in the hospital and usually requires an overnight stay.   The nurse will call you to schedule this procedure for after you have  had your teeth pulled.   Follow-Up: To be determined once procedure is scheduled.  *Please note that any paperwork needing to be filled out by the provider will need to be addressed at the front desk prior to seeing the provider. Please note that any FMLA, disability or other documents regarding health condition is subject to a $25.00 charge that must be received prior to completion of paperwork in the form of a money order or check.  Thank you for choosing [facility]!!   Trinidad Curet, RN [phone]  Any Other Special Instructions Will Be Listed Below (If Applicable).     Implantable Device Instructions  You are scheduled for:                  _____ Implantable Cardioverter Defibrillator  on  _______  with Dr. ________.  1.   Please arrive at the [facility], Entrance "A"  at [facility] at  ______ on the day of your procedure. (The address is [address]  2. Do not eat or drink after midnight the night before your procedure.  3.   Complete pre procedure  lab  work on _______.  The lab at [facility] is open from 8:00 AM to 4:30 PM.  You do not have to be fasting.  4.   Medication instructions:   ____________  5.  Plan for an overnight stay.  Bring your insurance cards and a list of you medications.  6.  Wash your chest and neck with surgical scrub the evening before and the morning of  your procedure.  Rinse well. Please review the surgical scrub instruction sheet given to you.  7. Your chest will need to be shaved prior to this procedure (if needed). We ask that you do this yourself at home 1 to 2 days before or if uncomfortable/unable to do yourself, then it will be performed by the hospital staff the day of.                                                                                                                * If you have ANY  questions after you get home, please call Trinidad Curet, RN @ [phone].  * Every attempt is made to prevent procedures from being rescheduled.  Due to the nature of  Electrophysiology, rescheduling can happen.  The physician is always aware and directs the staff when this occurs.        Cardioverter Defibrillator Implantation An implantable cardioverter defibrillator (ICD) is a small, lightweight, battery-powered device that is placed (implanted) under the skin in the chest or abdomen. Your caregiver may prescribe an ICD if:  You have had an irregular heart rhythm (arrhythmia) that originated in the lower chambers of the heart (ventricles).  Your heart has been damaged by a disease (such as coronary artery disease) or heart condition (such as a heart attack). An ICD consists of a battery that lasts several years, a small computer called a pulse generator, and wires called leads that go into the heart. It is used to detect and correct two dangerous arrhythmias: a rapid heart rhythm (tachycardia) and an arrhythmia in which the ventricles contract in an uncoordinated way (fibrillation). When an  ICD detects tachycardia, it sends an electrical signal to the heart that restores the heartbeat to normal (cardioversion). This signal is usually painless. If cardioversion does not work or if the ICD detects fibrillation, it delivers a small electrical shock to the heart (defibrillation) to restart the heart. The shock may feel like a strong jolt in the chest. ICDs may be programmed to correct other problems. Sometimes, ICDs are programmed to act as another type of implantable device called a pacemaker. Pacemakers are used to treat a slow heartbeat (bradycardia). LET YOUR CAREGIVER KNOW ABOUT:  Any allergies you have.  All medicines you are taking, including vitamins, herbs, eyedrops, and over-the-counter medicines and creams.  Previous problems you or members of your family have had with the use of anesthetics.  Any blood disorders you have had.  Other health problems you have. RISKS AND COMPLICATIONS Generally, the procedure to implant an ICD is safe. However, as with any surgical procedure, complications can occur. Possible complications associated with implanting an ICD include:  Swelling, bleeding, or bruising at the site where the ICD was implanted.  Infection at the site where the ICD was implanted.  A reaction to medicine used during the procedure.  Nerve, heart, or blood vessel damage.  Blood clots. BEFORE THE PROCEDURE  You may need to have blood tests, heart tests, or a chest X-ray done before the day of the procedure.  Ask your caregiver about changing or stopping your regular medicines.  Make plans to have someone drive you home. You may need to stay in the hospital overnight after the procedure.  Stop smoking at least 24 hours before the procedure.  Take a bath or shower the night before the procedure. You may need to scrub your chest or abdomen with a special type of soap.  Do not eat or drink before your procedure for as long as directed by your caregiver. Ask if  it is okay to take any needed medicine with a small sip of water. PROCEDURE  The procedure to implant an ICD in your chest or abdomen is usually done at a hospital in a room that has a large X-ray machine called a fluoroscope. The machine will be above you during the procedure. It will help your caregiver see your heart during the procedure. Implanting an ICD usually takes 1-3 hours. Before the procedure:   Small monitors will be put on your body.  They will be used to check your heart, blood pressure, and oxygen level.  A needle will be put into a vein in your hand or arm. This is called an intravenous (IV) access tube. Fluids and medicine will flow directly into your body through the IV tube.  Your chest or abdomen will be cleaned with a germ-killing (antiseptic) solution. The area may be shaved.  You may be given medicine to help you relax (sedative).  You will be given a medicine called a local anesthetic. This medicine will make the surgical site numb while the ICD is implanted. You will be sleepy but awake during the procedure. After you are numb the procedure will begin. The caregiver will:  Make a small cut (incision). This will make a pocket deep under your skin that will hold the pulse generator.  Guide the leads through a large blood vessel into your heart and attach them to the heart muscles. Depending on the ICD, the leads may go into one ventricle or they may go to both ventricles and into an upper chamber of the heart (atrium).  Test the ICD.  Close the incision with stitches, glue, or staples. AFTER THE PROCEDURE  You may feel pain. Some pain is normal. It may last a few days.  You may stay in a recovery area until the local anesthetic has worn off. Your blood pressure and pulse will be checked often. You will be taken to a room where your heart will be monitored.  A chest X-ray will be taken. This is done to check that the cardioverter defibrillator is in the right  place.  You may stay in the hospital overnight.  A slight bump may be seen over the skin where the ICD was placed. Sometimes, it is possible to feel the ICD under the skin. This is normal.  In the months and years afterward, your caregiver will check the device, the leads, and the battery every few months. Eventually, when the battery is low, the ICD will be replaced.   This information is not intended to replace advice given to you by your health care provider. Make sure you discuss any questions you have with your health care provider.   Document Released: 09/28/2001 Document Revised: 10/27/2012 Document Reviewed: 01/26/2012 Elsevier Interactive Patient Education 2016 [place] Defibrillator Implantation, Care After This sheet gives you information about how to care for yourself after your procedure. Your health care provider may also give you more specific instructions. If you have problems or questions, contact your health care provider. What can I expect after the procedure? After the procedure, it is common to have:  Some pain. It may last a few days.  A slight bump over the skin where the device was placed. Sometimes, it is possible to feel the device under the skin. This is normal.  During the months and years after your procedure, your health care provider will check the device, the leads, and the battery every few months. Eventually, when the battery is low, the device will be replaced. Follow these instructions at home: Medicines  Take over-the-counter and prescription medicines only as told by your health care provider.  If you were prescribed an antibiotic medicine, take it as told by your health care provider. Do not stop taking the antibiotic even if you start to feel better. Incision care   Follow instructions from your health care provider about how to take care of your incision area. Make sure you: ?  Wash your hands with soap and water before  you change your bandage (dressing). If soap and water are not available, use hand sanitizer. ? Change your dressing as told by your health care provider. ? Leave stitches (sutures), skin glue, or adhesive strips in place. These skin closures may need to stay in place for 2 weeks or longer. If adhesive strip edges start to loosen and curl up, you may trim the loose edges. Do not remove adhesive strips completely unless your health care provider tells you to do that.  Check your incision area every day for signs of infection. Check for: ? More redness, swelling, or pain. ? More fluid or blood. ? Warmth. ? Pus or a bad smell.  Do not use lotions or ointments near the incision area unless told by your health care provider.  Keep the incision area clean and dry for 2-3 days after the procedure or for as long as told by your health care provider. It takes several weeks for the incision site to heal completely.  Do not take baths, swim, or use a hot tub until your health care provider approves. Activity  Try to walk a little every day. Exercising is important after this procedure. Also, use your shoulder on the side of the defibrillator in daily tasks that do not require a lot of motion.  For at least 6 weeks: ? Do not lift your upper arm above your shoulders. This means no tennis, golf, or swimming for this period of time. If you tend to sleep with your arm above your head, use a restraint to prevent this during sleep. ? Avoid sudden jerking, pulling, or chopping movements that pull your upper arm far away from your body.  Ask your health care provider when you may go back to work.  Check with your health care provider before you start to drive or play sports. Electric and magnetic fields  Tell all health care providers that you have a defibrillator. This may prevent them from giving you an MRI scan because strong magnets are used for that test.  If you must pass through a metal detector,  quickly walk through it. Do not stop under the detector, and do not stand near it.  Avoid places or objects that have a strong electric or magnetic field, including: ? Airport [employer]. At the airport, let officials know that you have a defibrillator. Your defibrillator ID card will let you be checked in a way that is safe for you and will not damage your defibrillator. Also, do not let a security person wave a magnetic wand near your defibrillator. That can make it stop working. ? Power plants. ? Large electrical generators. ? Anti-theft systems or electronic article surveillance (EAS). ? Radiofrequency transmission towers, such as cell phone and radio towers.  Do not use amateur (ham) radio equipment or electric (arc) welding torches. Some devices are safe to use if held at least 12 inches (30 cm) from your defibrillator. These include power tools, lawn mowers, and speakers. If you are unsure if something is safe to use, ask your health care provider.  Do not use MP3 player headphones. They have magnets.  You may safely use electric blankets, heating pads, computers, and microwave ovens.  When using your cell phone, hold it to the ear that is on the opposite side from the defibrillator. Do not leave your cell phone in a pocket over the defibrillator. General instructions  Follow diet instructions from your health care provider,  if this applies.  Always keep your defibrillator ID card with you. The card should list the implant date, device model, and manufacturer. Consider wearing a medical alert bracelet or necklace.  Have your defibrillator checked every 3-6 months or as often as told by your health care provider. Most defibrillators last for 4-8 years.  Keep all follow-up visits as told by your health care provider. This is important for your health care provider to make sure your chest is healing the way it should. Ask your health care provider when you should come back to have  your stitches or staples taken out. Contact a health care provider if:  You feel one shock in your chest.  You gain weight suddenly.  Your legs or feet swell more than they have before.  It feels like your heart is fluttering or skipping beats (heart palpitations).  You have more redness, swelling, or pain around your incision.  You have more fluid or blood coming from your incision.  Your incision feels warm to the touch.  You have pus or a bad smell coming from your incision.  You have a fever. Get help right away if:  You have chest pain.  You feel more than one shock.  You feel more short of breath than you have felt before.  You feel more light-headed than you have felt before.  Your incision starts to open up. This information is not intended to replace advice given to you by your health care provider. Make sure you discuss any questions you have with your health care provider. Document Released: 07/26/2004 Document Revised: 07/27/2015 Document Reviewed: 06/13/2015 Elsevier Interactive Patient Education  2018 [place] Discharge Instructions for  Pacemaker/Defibrillator Patients  ACTIVITY No heavy lifting or vigorous activity with your left/right arm for 6 to 8 weeks.  Do not raise your left/right arm above your head for one week.  Gradually raise your affected arm as drawn below.           __  NO DRIVING for     ; you may begin driving on     .  WOUND CARE - Keep the wound area clean and dry.  Do not get this area wet for one week. No showers for one week; you may shower on     . - The tape/steri-strips on your wound will fall off; do not pull them off.  No bandage is needed on the site.  DO  NOT apply any creams, oils, or ointments to the wound area. - If you notice any drainage or discharge from the wound, any swelling or bruising at the site, or you develop a fever > 101? F after you are discharged home, call the office at  once.  SPECIAL INSTRUCTIONS - You are still able to use cellular telephones; use the ear opposite the side where you have your pacemaker/defibrillator.  Avoid carrying your cellular phone near your device. - When traveling through airports, show security personnel your identification card to avoid being screened in the metal detectors.  Ask the security personnel to use the hand wand. - Avoid arc welding equipment, MRI testing (magnetic resonance imaging), TENS units (transcutaneous nerve stimulators).  Call the office for questions about other devices. - Avoid electrical appliances that are in poor condition or are not properly grounded. - Microwave ovens are safe to be near or to operate.  ADDITIONAL INFORMATION FOR DEFIBRILLATOR PATIENTS SHOULD YOUR DEVICE GO OFF: - If your device  goes off ONCE and you feel fine afterward, notify the device clinic nurses. - If your device goes off ONCE and you do not feel well afterward, call 911. - If your device goes off TWICE, call 911. - If your device goes off [place], call 911.  DO NOT DRIVE YOURSELF OR A FAMILY MEMBER WITH A DEFIBRILLATOR TO THE HOSPITAL-CALL 911.

## 2018-02-12 NOTE — Progress Notes (Signed)
Electrophysiology Office Note   Date:  02/12/2018   ID:  Kara Williams, DOB 1950-02-01, MRN [id number]  PCP:  Orma Flaming, MD  Cardiologist:  Oval Linsey Primary Electrophysiologist:  Will Meredith Leeds, MD    No chief complaint on file.    History of Present Illness: Kara Williams is a 68 y.o. female who is being seen today for the evaluation of CHF at the request of Skeet Latch, MD. Presenting today for electrophysiology evaluation.  Has a history of chronic systolic and diastolic heart failure, left bundle branch block, coronary disease status post MI and CABG, hypertension, hyperlipidemia, diabetes, and COPD.  She recently moved to [place] from [place].  She had an MI in 2019.  She presented with chest and left arm pain.  She underwent CABG with annuloplasty ring.  She recently had her teeth cleaned and had fevers and chills twice.  Her dentist felt that it was due to transient bacteremia.  She has not had fevers or chills since that time.    Today, she denies symptoms of palpitations, chest pain, shortness of breath, orthopnea, PND, lower extremity edema, claudication, dizziness, presyncope, syncope, bleeding, or neurologic sequela. The patient is tolerating medications without difficulties.    Past Medical History:  Diagnosis Date  . Arthritis   . Asthma   . Cancer (HCC)    Skin cancer/ basel cell benign   . Colon ulcer   . Depression   . Diabetes mellitus without complication ([place])   . Emphysema of lung ([place])   . Frequent headaches   . Heart disease   . Hyperlipidemia   . Hypertension   . Osteoarthritis   . Osteopenia   . Panic attack   . Psoriasis   . PTSD (post-traumatic stress disorder)    Needle Phobia  . Urine incontinence    Past Surgical History:  Procedure Laterality Date  . CARDIAC SURGERY  2019   Triple bypass  . CHOLECYSTECTOMY    . EYE SURGERY  2000  . LEG SURGERY  2016  . NECK SURGERY  2012  . TUBAL LIGATION  1975     Current  Outpatient Medications  Medication Sig Dispense Refill  . ACCU-CHEK FASTCLIX LANCETS MISC USE UP TO 4 TIMES A DAY AS DIRECTED 102 each 0  . albuterol (PROVENTIL) (2.5 MG/3ML) 0.083% nebulizer solution Take 2.5 mg by nebulization every 6 (six) hours as needed for wheezing or shortness of breath.    . ALPRAZolam (XANAX) 1 MG tablet Take 1 tablet (1 mg total) by mouth at bedtime as needed for anxiety. 10 tablet 0  . Ascorbic Acid (VITAMIN C) 1000 MG tablet Take 1,000 mg by mouth daily.    Marland Kitchen aspirin EC 81 MG tablet Take 81 mg by mouth daily.    Marland Kitchen atorvastatin (LIPITOR) 80 MG tablet Take 1 tablet (80 mg total) by mouth at bedtime. 30 tablet 0  . baclofen (LIORESAL) 10 MG tablet Take 1 tablet (10 mg total) by mouth 2 (two) times daily. 60 tablet 2  . benzonatate (TESSALON) 100 MG capsule Take 1 capsule (100 mg total) by mouth 3 (three) times daily as needed for cough. 20 capsule 0  . blood glucose meter kit and supplies Dispense based on patient and insurance preference. Use up to four times daily as directed. (FOR ICD-10 E10.9, E11.9). 1 each 0  . Calcium Carb-Cholecalciferol (CALCIUM 1000 + D) 1000-800 MG-UNIT TABS Take 1 tablet by mouth 2 (two) times daily. 60 tablet 0  . carvedilol (  COREG) 6.25 MG tablet Take 1 tablet (6.25 mg total) by mouth every 12 (twelve) hours. 60 tablet 0  . Cholecalciferol (VITAMIN D3 PO) Take 1,000 Units by mouth.    . clopidogrel (PLAVIX) 75 MG tablet Take 1 tablet (75 mg total) by mouth daily. 30 tablet 0  . Diclofenac Sodium (PENNSAID) 2 % SOLN Place 0.5 g onto the skin 2 (two) times daily as needed. 1 Bottle 2  . DM-APAP-CPM (CORICIDIN HBP FLU PO) Take by mouth.    . empagliflozin (JARDIANCE) 25 MG TABS tablet Take 25 mg by mouth daily. 90 tablet 3  . ENTRESTO 24-26 MG Take 1 tablet by mouth 2 (two) times daily. 180 tablet 3  . fluticasone (FLONASE) 50 MCG/ACT nasal spray Place 1 spray into both nostrils daily. 16 g 3  . glucose blood test strip USE UP TO 4 TIMES DAILY  AS DIRECTED 100 each 4  . [START ON 03/04/2018] HYDROcodone-acetaminophen (NORCO) 10-325 MG tablet Take 1 tablet by mouth every 8 (eight) hours as needed for up to 30 days for severe pain. 90 tablet 0  . Insulin Pen Needle (DROPLET PEN NEEDLES) 32G X 5 MM MISC by Does not apply route. 32 gauge x 5/32    . KLOR-CON M20 20 MEQ tablet TAKE 1 TABLET BY MOUTH EVERY DAY 30 tablet 0  . LANTUS SOLOSTAR 100 UNIT/ML Solostar Pen 10 Units daily.   0  . levofloxacin (LEVAQUIN) 500 MG tablet Take 1 tablet (500 mg total) by mouth daily. 7 tablet 0  . metFORMIN (GLUCOPHAGE) 1000 MG tablet Take 1 tablet (1,000 mg total) by mouth 2 (two) times daily with a meal. 180 tablet 3  . montelukast (SINGULAIR) 10 MG tablet Take 1 tablet (10 mg total) by mouth at bedtime. 30 tablet 0  . MYRBETRIQ 25 MG TB24 tablet Take 1 tablet (25 mg total) by mouth daily. 30 tablet 0  . ondansetron (ZOFRAN ODT) 4 MG disintegrating tablet Take 1 tablet (4 mg total) by mouth every 8 (eight) hours as needed for nausea or vomiting. 20 tablet 0  . ondansetron (ZOFRAN) 4 MG tablet Take 4 mg by mouth every 8 (eight) hours as needed for nausea or vomiting.    . pantoprazole (PROTONIX) 40 MG tablet Take 1 tablet (40 mg total) by mouth daily. 30 tablet 3  . pregabalin (LYRICA) 150 MG capsule Take 1 capsule (150 mg total) by mouth 2 (two) times daily. 60 capsule 2  . ranitidine (ZANTAC) 150 MG tablet     . SYMBICORT 160-4.5 MCG/ACT inhaler Inhale 2 puffs into the lungs 2 (two) times daily. Rinse mouth after each use 3 Inhaler 3  . VENTOLIN HFA 108 (90 Base) MCG/ACT inhaler Inhale 1-2 puffs into the lungs every 6 (six) hours as needed for wheezing or shortness of breath. 3 Inhaler 1   No current facility-administered medications for this visit.     Allergies:   Erythromycin; Penicillins; Sulfa antibiotics; Lactose intolerance (gi); Cortisone; and Tetracyclines & related   Social History:  The patient  reports that she quit smoking about 40 years  ago. She has never used smokeless tobacco. She reports that she does not drink alcohol or use drugs.   Family History:  The patient's family history includes Alcohol abuse in her father and mother; Arthritis in her father, mother, and sister; Asthma in her maternal grandfather, maternal grandmother, paternal grandfather, paternal grandmother, and sister; Bipolar disorder in her father, mother, and sister; COPD in her father, maternal grandfather, maternal  grandmother, mother, paternal grandfather, paternal grandmother, and sister; Depression in her father, mother, and sister; Diabetes in her maternal grandfather, maternal grandmother, paternal grandfather, paternal grandmother, and sister; Drug abuse in her father and mother; Early death in her father and mother; Heart attack in her father, maternal grandfather, maternal grandmother, mother, paternal grandfather, paternal grandmother, and sister; Heart disease in her father, maternal grandfather, maternal grandmother, mother, paternal grandfather, paternal grandmother, and sister; Hyperlipidemia in her father, maternal grandfather, maternal grandmother, mother, paternal grandfather, paternal grandmother, and sister; Hypertension in her father, maternal grandfather, maternal grandmother, mother, paternal grandfather, paternal grandmother, and sister; Miscarriages / [place] in her mother.    ROS:  Please see the history of present illness.   Otherwise, review of systems is positive for none.   All other systems are reviewed and negative.    PHYSICAL EXAM: VS:  BP 124/62   Pulse 75   Ht '5\' 4"'  (1.626 m)   Wt 171 lb (77.6 kg)   LMP  (LMP Unknown)   SpO2 96%   BMI 29.35 kg/m  , BMI Body mass index is 29.35 kg/m. GEN: Well nourished, well developed, in no acute distress  HEENT: normal  Neck: no JVD, carotid bruits, or masses Cardiac: RRR; no murmurs, rubs, or gallops,no edema  Respiratory:  clear to auscultation bilaterally, normal work of  breathing GI: soft, nontender, nondistended, + BS MS: no deformity or atrophy  Skin: warm and dry Neuro:  Strength and sensation are intact Psych: euthymic mood, full affect  EKG:  EKG is not ordered today. Personal review of the ekg ordered 12/31/17 shows this rhythm, left bundle branch block  Recent Labs: 01/08/2018: ALT 13; BUN 18; Creatinine, Ser 1.13; Hemoglobin 14.0; Platelets 242.0; Potassium 4.1; Pro B Natriuretic peptide (BNP) 87.0; Sodium 136; TSH 0.70    Lipid Panel     Component Value Date/Time   CHOL 120 01/08/2018 1455   TRIG 98.0 01/08/2018 1455   HDL 50.20 01/08/2018 1455   CHOLHDL 2 01/08/2018 1455   VLDL 19.6 01/08/2018 1455   LDLCALC 50 01/08/2018 1455     Wt Readings from Last 3 Encounters:  02/12/18 171 lb (77.6 kg)  02/02/18 170 lb (77.1 kg)  01/08/18 170 lb 3.2 oz (77.2 kg)      Other studies Reviewed: Additional studies/ records that were reviewed today include: TTE 01/26/18  Review of the above records today demonstrates:  - Left ventricle: The cavity size was normal. Wall thickness was   normal. Systolic function was moderately to severely reduced. The   estimated ejection fraction was in the range of 30% to 35%. Wall   motion was normal; there were no regional wall motion   abnormalities. Doppler parameters are consistent with abnormal   left ventricular relaxation (grade 1 diastolic dysfunction). - Ventricular septum: Septal motion showed abnormal function and   dyssynergy.   ASSESSMENT AND PLAN:  1.  Chronic systolic heart failure due to ischemic cardiomyopathy: Ejection fraction 30 to 35%.  She does have a left bundle branch block.  She is currently on optimal medical therapy with Coreg and Entresto.  I discussed with her options of further therapy with ICD.  She would qualify for CRT.  Risks and benefits were discussed and include bleeding, tamponade, infection, pneumothorax.  Stands risks and is agreed to the procedure.  She has recently  had dental cleanings and need 5 teeth pulled.  Due to the high infection risk, she would need to have these teeth pulled prior  to her defibrillator being implanted.  This will need to be discussed with her oral surgeon and insurance company.  2.  Coronary artery disease status post CABG: Currently on aspirin, Plavix, Coreg, atorvastatin.  No current chest pain.    Current medicines are reviewed at length with the patient today.   The patient does not have concerns regarding her medicines.  The following changes were made today:  none  Labs/ tests ordered today include:  No orders of the defined types were placed in this encounter.  Case discussed with primary cardiology  Disposition:   FU with Will Camnitz 3 months  Signed, Will Meredith Leeds, MD  02/12/2018 10:29 AM     [address] [place] [address] [phone] (office) [phone] (fax)

## 2018-02-12 NOTE — Telephone Encounter (Signed)
PA sent

## 2018-02-15 ENCOUNTER — Telehealth: Payer: Self-pay

## 2018-02-15 ENCOUNTER — Encounter: Payer: Self-pay | Admitting: Family Medicine

## 2018-02-15 NOTE — Telephone Encounter (Signed)
I called the patient to schedule her AWV and completed that. I also read off the patient message that Dr. Rogers Blocker responded to and she asked to speak to a nurse about being depressed. Transferred the call to [facility].

## 2018-02-15 NOTE — Telephone Encounter (Signed)
Incoming call from patient.  Spent over 30 minutes on the phone with patient discussing issues of depression.  She has multiple healthcare issues going on as well as lots of family issues.  Pt very sad and weepy over the phone.  Denies any thoughts of suicide or intentions to harm anyone else.  Explained that Dr. Rogers Blocker was not here in the office tomorrow 1/28 but offered appt on Wednesday 1/29.  Patient declined due to lack of transportation.  Offered appt on Friday 1/31 and patient accepted a 9:40 appt with Dr. Rogers Blocker.

## 2018-02-16 ENCOUNTER — Telehealth: Payer: Self-pay | Admitting: *Deleted

## 2018-02-16 NOTE — Telephone Encounter (Signed)
  Received mychart message from patient:   IGood Morning! I guess you received the report from the other cardio dr. He said I have to have 5 teeth removed before he will do the defibrillator. My question is because I stopped the plavix on the 14th of Jan. Should I go back and start taking it again for the time being? Just let me know. Thank you all and have a great day.   Discussed with Dr Oval Linsey and confirmed patient did not need to be taking Plavix but should be on ASA 81 mg daily. Advised patient, verbalized understanding.

## 2018-02-19 ENCOUNTER — Encounter: Payer: Self-pay | Admitting: Family Medicine

## 2018-02-19 ENCOUNTER — Ambulatory Visit (INDEPENDENT_AMBULATORY_CARE_PROVIDER_SITE_OTHER): Payer: Medicare HMO | Admitting: Family Medicine

## 2018-02-19 VITALS — BP 110/64 | HR 70 | Temp 97.6°F | Ht 64.0 in | Wt 171.0 lb

## 2018-02-19 DIAGNOSIS — F339 Major depressive disorder, recurrent, unspecified: Secondary | ICD-10-CM

## 2018-02-19 DIAGNOSIS — R6889 Other general symptoms and signs: Secondary | ICD-10-CM | POA: Diagnosis not present

## 2018-02-19 MED ORDER — CITALOPRAM HYDROBROMIDE 20 MG PO TABS: 20.0000 mg | ORAL_TABLET | Freq: Every day | ORAL | 0 refills | Status: DC
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
## 2018-02-19 NOTE — Patient Instructions (Signed)
We are going to start you on celexa 20mg . See you back in one month and if we need to increase we can. If you like it and feel like you need to increase, email me around 2-3 weeks and we can increase to 40mg  if needed.   Praying for you Kara Williams. Let us know what else we can do for you during all of this.

## 2018-02-26 ENCOUNTER — Telehealth: Payer: Self-pay | Admitting: *Deleted

## 2018-02-26 ENCOUNTER — Ambulatory Visit (INDEPENDENT_AMBULATORY_CARE_PROVIDER_SITE_OTHER): Payer: Medicare HMO

## 2018-02-26 VITALS — BP 128/66 | HR 83 | Ht 64.0 in | Wt 162.8 lb

## 2018-02-26 DIAGNOSIS — R6889 Other general symptoms and signs: Secondary | ICD-10-CM | POA: Diagnosis not present

## 2018-02-26 DIAGNOSIS — Z0000 Encounter for general adult medical examination without abnormal findings: Secondary | ICD-10-CM | POA: Diagnosis not present

## 2018-02-26 DIAGNOSIS — Z13820 Encounter for screening for osteoporosis: Secondary | ICD-10-CM

## 2018-02-26 DIAGNOSIS — Z1211 Encounter for screening for malignant neoplasm of colon: Secondary | ICD-10-CM | POA: Diagnosis not present

## 2018-02-26 DIAGNOSIS — H9193 Unspecified hearing loss, bilateral: Secondary | ICD-10-CM

## 2018-02-26 NOTE — Progress Notes (Signed)
Subjective:   Kara Williams is a 68 y.o. female who presents for an Initial Medicare Annual Wellness Visit.  Review of Systems    No ROS.  Medicare Wellness Visit. Additional risk factors are reflected in the social history.  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 Patient enjoys making jewelery and cooking. She enjoys thrifting (flea markets and yard sPress photographer. She has grand daughters here who are about to have babies. [place] and accommodations to allow her more independence.  Patient goes to bed around 2-6am. She wakes up between 7:30am-2pm. No CPAP. Occasionally wakes up to go to the bathroom.     Objective:    Today's Vitals   02/26/18 1442  BP: 128/66  Pulse: 83  SpO2: 95%  Weight: 162 lb 12.8 oz (73.8 kg)  Height: '5\' 4"'  (1.626 m)   Body mass index is 27.94 kg/m.  Advanced Directives 02/26/2018 01/24/2018  Does Patient Have a Medical Advance Directive? No Yes  Type of [place] Living [place]  Does patient want to make changes to medical advance directive? Yes (MAU/Ambulatory/Procedural Areas - Information given) -   Per the order of the physician, and with the permission of the patient, Advanced Medical Directives were discussed >18 minutes including Living Will and [place] Patient verbalized understanding. Packet provided.  Current Medications (verified) Outpatient Encounter Medications as of 02/26/2018  Medication Sig  . ACCU-CHEK FASTCLIX LANCETS MISC USE UP TO 4 TIMES A DAY AS DIRECTED  . albuterol (PROVENTIL) (2.5 MG/3ML) 0.083% nebulizer solution Take 2.5 mg by nebulization every 6 (six) hours as needed for wheezing or shortness of breath.  . ALPRAZolam (XANAX) 1 MG tablet Take 1 tablet  (1 mg total) by mouth at bedtime as needed for anxiety.  . Ascorbic Acid (VITAMIN C) 1000 MG tablet Take 1,000 mg by mouth daily.  .Marland Kitchenaspirin EC 81 MG tablet Take 81 mg by mouth daily.  .Marland Kitchenatorvastatin (LIPITOR) 80 MG tablet Take 1 tablet (80 mg total) by mouth at bedtime.  . baclofen (LIORESAL) 10 MG tablet Take 1 tablet (10 mg total) by mouth 2 (two) times daily.  . blood glucose meter kit and supplies Dispense based on patient and insurance preference. Use up to four times daily as directed. (FOR ICD-10 E10.9, E11.9).  . Calcium Carb-Cholecalciferol (CALCIUM 1000 + D) 1000-800 MG-UNIT TABS Take 1 tablet by mouth 2 (two) times daily.  . carvedilol (COREG) 6.25 MG tablet Take 1 tablet (6.25 mg total) by mouth every 12 (twelve) hours.  . Cholecalciferol (VITAMIN D3 PO) Take 1,000 Units by mouth.  . citalopram (CELEXA) 20 MG tablet Take 1 tablet (20 mg total) by mouth daily.  . Diclofenac Sodium (PENNSAID) 2 % SOLN Place 0.5 g onto the skin 2 (two) times daily as needed.  .Marland KitchenDM-APAP-CPM (CORICIDIN HBP FLU PO) Take by mouth.  . empagliflozin (JARDIANCE) 25 MG TABS tablet Take 25 mg by mouth daily.  .Marland KitchenENTRESTO 24-26 MG Take 1 tablet by mouth 2 (two) times daily.  . fluticasone (FLONASE) 50 MCG/ACT nasal spray Place 1 spray into both nostrils daily.  .Marland Kitchenglucose blood test strip USE UP TO 4 TIMES DAILY AS DIRECTED  . [START ON 03/04/2018] HYDROcodone-acetaminophen (NORCO) 10-325 MG tablet Take 1 tablet by mouth every  8 (eight) hours as needed for up to 30 days for severe pain.  . Insulin Pen Needle (DROPLET PEN NEEDLES) 32G X 5 MM MISC by Does not apply route. 32 gauge x 5/32  . KLOR-CON M20 20 MEQ tablet TAKE 1 TABLET BY MOUTH EVERY DAY  . LANTUS SOLOSTAR 100 UNIT/ML Solostar Pen 10 Units daily.   . metFORMIN (GLUCOPHAGE) 1000 MG tablet Take 1 tablet (1,000 mg total) by mouth 2 (two) times daily with a meal.  . montelukast (SINGULAIR) 10 MG tablet Take 1 tablet (10 mg total) by mouth at bedtime.  Marland Kitchen  MYRBETRIQ 25 MG TB24 tablet Take 1 tablet (25 mg total) by mouth daily.  . ondansetron (ZOFRAN ODT) 4 MG disintegrating tablet Take 1 tablet (4 mg total) by mouth every 8 (eight) hours as needed for nausea or vomiting.  . ondansetron (ZOFRAN) 4 MG tablet Take 4 mg by mouth every 8 (eight) hours as needed for nausea or vomiting.  . pantoprazole (PROTONIX) 40 MG tablet Take 1 tablet (40 mg total) by mouth daily.  Marland Kitchen PAZEO 0.7 % SOLN   . pregabalin (LYRICA) 150 MG capsule Take 1 capsule (150 mg total) by mouth 2 (two) times daily.  . SYMBICORT 160-4.5 MCG/ACT inhaler Inhale 2 puffs into the lungs 2 (two) times daily. Rinse mouth after each use  . VENTOLIN HFA 108 (90 Base) MCG/ACT inhaler Inhale 1-2 puffs into the lungs every 6 (six) hours as needed for wheezing or shortness of breath.  . [DISCONTINUED] ranitidine (ZANTAC) 150 MG tablet   . [DISCONTINUED] benzonatate (TESSALON) 100 MG capsule Take 1 capsule (100 mg total) by mouth 3 (three) times daily as needed for cough.   No facility-administered encounter medications on file as of 02/26/2018.     Allergies (verified) Erythromycin; Penicillins; Sulfa antibiotics; Lactose intolerance (gi); Cortisone; and Tetracyclines & related   History: Past Medical History:  Diagnosis Date  . Arthritis   . Asthma   . Cancer (HCC)    Skin cancer/ basel cell benign   . Colon ulcer   . Depression   . Diabetes mellitus without complication ([place])   . Emphysema of lung ([place])   . Frequent headaches   . Heart disease   . Hyperlipidemia   . Hypertension   . Osteoarthritis   . Osteopenia   . Panic attack   . Psoriasis   . PTSD (post-traumatic stress disorder)    Needle Phobia  . Urine incontinence    Past Surgical History:  Procedure Laterality Date  . CARDIAC SURGERY  2019   Triple bypass  . CHOLECYSTECTOMY    . EYE SURGERY  2000  . LEG SURGERY  2016  . NECK SURGERY  2012  . TUBAL LIGATION  1975   Family History  Problem Relation Age of Onset   . Alcohol abuse Mother   . Arthritis Mother   . COPD Mother   . Depression Mother   . Drug abuse Mother   . Early death Mother   . Heart attack Mother   . Heart disease Mother   . Hyperlipidemia Mother   . Hypertension Mother   . Bipolar disorder Mother   . Miscarriages / [place] Mother   . Alcohol abuse Father   . Arthritis Father   . COPD Father   . Depression Father   . Drug abuse Father   . Early death Father   . Heart disease Father   . Hyperlipidemia Father   . Hypertension Father   .  Heart attack Father   . Bipolar disorder Father   . Arthritis Sister   . Asthma Sister   . Depression Sister   . COPD Sister   . Diabetes Sister   . Heart attack Sister   . Heart disease Sister   . Hyperlipidemia Sister   . Hypertension Sister   . Bipolar disorder Sister   . Asthma Maternal Grandmother   . COPD Maternal Grandmother   . Diabetes Maternal Grandmother   . Hyperlipidemia Maternal Grandmother   . Heart disease Maternal Grandmother   . Hypertension Maternal Grandmother   . Heart attack Maternal Grandmother   . Asthma Maternal Grandfather   . COPD Maternal Grandfather   . Diabetes Maternal Grandfather   . Heart disease Maternal Grandfather   . Hyperlipidemia Maternal Grandfather   . Hypertension Maternal Grandfather   . Heart attack Maternal Grandfather   . Asthma Paternal Grandmother   . COPD Paternal Grandmother   . Diabetes Paternal Grandmother   . Heart disease Paternal Grandmother   . Hypertension Paternal Grandmother   . Hyperlipidemia Paternal Grandmother   . Heart attack Paternal Grandmother   . Asthma Paternal Grandfather   . COPD Paternal Grandfather   . Diabetes Paternal Grandfather   . Heart disease Paternal Grandfather   . Hyperlipidemia Paternal Grandfather   . Hypertension Paternal Grandfather   . Heart attack Paternal Grandfather    Social History   Socioeconomic History  . Marital status: Single    Spouse name: Not on file  .  Number of children: Not on file  . Years of education: Not on file  . Highest education level: Not on file  Occupational History  . Not on file  Social Needs  . Financial resource strain: Not on file  . Food insecurity:    Worry: Not on file    Inability: Not on file  . Transportation needs:    Medical: Not on file    Non-medical: Not on file  Tobacco Use  . Smoking status: Former Smoker    Last attempt to quit: 01/20/1978    Years since quitting: 40.1  . Smokeless tobacco: Never Used  Substance and Sexual Activity  . Alcohol use: Never    Frequency: Never  . Drug use: Never  . Sexual activity: Not on file  Lifestyle  . Physical activity:    Days per week: Not on file    Minutes per session: Not on file  . Stress: Not on file  Relationships  . Social connections:    Talks on phone: Not on file    Gets together: Not on file    Attends religious service: Not on file    Active member of club or organization: Not on file    Attends meetings of clubs or organizations: Not on file    Relationship status: Not on file  Other Topics Concern  . Not on file  Social History Narrative  . Not on file    Tobacco Counseling Counseling given: Not Answered      Activities of Daily Living In your present state of health, do you have any difficulty performing the following activities: 02/26/2018  Hearing? N  Vision? N  Difficulty concentrating or making decisions? N  Walking or climbing stairs? Y  Dressing or bathing? N  Doing errands, shopping? N  Preparing Food and eating ? N  Using the Toilet? N  In the past six months, have you accidently leaked urine? Y  Comment Takes Myrbetriq  Do you have problems with loss of bowel control? N  Managing your Medications? N  Managing your Finances? N  Housekeeping or managing your Housekeeping? N     Immunizations and Health Maintenance Immunization History  Administered Date(s) Administered  . Td 02/02/2017   Health Maintenance  Due  Topic Date Due  . OPHTHALMOLOGY EXAM  11[birth year]20/1960  . MAMMOGRAM  11/20/2000  . COLONOSCOPY  11/20/2000  . DEXA SCAN  11/21/2015  . PNA vac Low Risk Adult (1 of 2 - PCV13) 11/21/2015    Patient Care Team: Orma Flaming, MD as PCP - General (Family Medicine) Constance Haw, MD as PCP - Electrophysiology (Cardiology)  Indicate any recent Medical Services you may have received from other than [facility] providers in the past year (date may be approximate).     Assessment:   This is a routine wellness examination for Jezabella.  Hearing/Vision screen  Hearing Screening   Method: Audiometry   '125Hz'  '250Hz'  '500Hz'  '1000Hz'  '2000Hz'  '3000Hz'  '4000Hz'  '6000Hz'  '8000Hz'   Right ear:   Fail Fail Pass  Pass    Left ear:   Fail Fail Pass  Pass    Comments: Referral placed to ENT per patient request   Visual Acuity Screening   Right eye Left eye Both eyes  Without correction: '20/20 20/30 20/20 '  With correction:     Comments: Has glasses she wears sometimes when reading   Dietary issues and exercise activities discussed: Current Exercise Habits: The patient does not participate in regular exercise at present, Exercise limited by: orthopedic condition(s)  Breakfast: Oatmeal with water or unsweet tea  Lunch: Skips lunch normally  Dinner: [place] stroganoff and mixed vegetables, water to drink Goals    . Patient Stated     Patient would like to increase energy level      Depression Screen PHQ 2/9 Scores 02/26/2018 02/19/2018 02/02/2018 11/30/2017  PHQ - 2 Score 2 4 0 0  PHQ- 9 Score 12 15 - -    Fall Risk Fall Risk  02/26/2018 02/02/2018 01/07/2018 11/30/2017  Falls in the past year? '1 1 1 1  ' Number falls in past yr: 0 0 1 1  Injury with Fall? 1 1 (No Data) 1  Comment Slipped in mud, tore ACL - tore ACL/ Shoulder dislocation -  Risk for fall due to : Impaired mobility - - -     Cognitive Function: MMSE - Mini Mental State Exam 02/26/2018  Orientation to time 5  Orientation to Place 5    Registration 3  Attention/ Calculation 5  Recall 3  Language- name 2 objects 2  Language- repeat 1  Language- follow 3 step command 3  Language- read & follow direction 1  Write a sentence 1  Copy design 1  Total score 30        Screening Tests Health Maintenance  Topic Date Due  . OPHTHALMOLOGY EXAM  11/20/1960  . MAMMOGRAM  11/20/2000  . COLONOSCOPY  11/20/2000  . DEXA SCAN  11/21/2015  . PNA vac Low Risk Adult (1 of 2 - PCV13) 11/21/2015  . INFLUENZA VACCINE  04/20/2018 (Originally 08/20/2017)  . HEMOGLOBIN A1C  07/10/2018  . URINE MICROALBUMIN  01/09/2019  . FOOT EXAM  02/27/2019  . TETANUS/TDAP  02/03/2027  . Hepatitis C Screening  Completed        Plan:  Follow Up with PCP as Advised  I have personally reviewed and noted the following in the patient's chart:   . Medical and social history .  Use of alcohol, tobacco or illicit drugs  . Current medications and supplements . Functional ability and status . Nutritional status . Physical activity . Advanced directives . List of other physicians . Vitals . Screenings to include cognitive, depression, and falls . Referrals and appointments  In addition, I have reviewed and discussed with patient certain preventive protocols, quality metrics, and best practice recommendations. A written personalized care plan for preventive services as well as general preventive health recommendations were provided to patient.     Williams, [place]   03/25/6813

## 2018-02-26 NOTE — Patient Instructions (Signed)
Kara Williams , Thank you for taking time to come for your Medicare Wellness Visit. I appreciate your ongoing commitment to your health goals. Please review the following plan we discussed and let me know if I can assist you in the future.   These are the goals we discussed: Goals    . Patient Stated     Patient would like to increase energy level       This is a list of the screening recommended for you and due dates:  Health Maintenance  Topic Date Due  . Eye exam for diabetics  11/20[birth year]1960  . Mammogram  11/20/2000  . Colon Cancer Screening  11/20/2000  . DEXA scan (bone density measurement)  11/21/2015  . Pneumonia vaccines (1 of 2 - PCV13) 11/21/2015  . Flu Shot  04/20/2018*  . Hemoglobin A1C  07/10/2018  . Urine Protein Check  01/09/2019  . Complete foot exam   02/27/2019  . Tetanus Vaccine  02/03/2027  .  Hepatitis C: One time screening is recommended by Center for Disease Control  (CDC) for  adults born from 50 through 1965.   Completed  *Topic was postponed. The date shown is not the original due date.   Preventive Care for Adults  A healthy lifestyle and preventive care can promote health and wellness. Preventive health guidelines for adults include the following key practices.  . A routine yearly physical is a good way to check with your health care provider about your health and preventive screening. It is a chance to share any concerns and updates on your health and to receive a thorough exam.  . Visit your dentist for a routine exam and preventive care every 6 months. Brush your teeth twice a day and floss once a day. Good oral hygiene prevents tooth decay and gum disease.  . The frequency of eye exams is based on your age, health, family medical history, use  of contact lenses, and other factors. Follow your health care provider's recommendations for frequency of eye exams.  . Eat a healthy diet. Foods like vegetables, fruits, whole grains, low-fat dairy products,  and lean protein foods contain the nutrients you need without too many calories. Decrease your intake of foods high in solid fats, added sugars, and salt. Eat the right amount of calories for you. Get information about a proper diet from your health care provider, if necessary.  . Regular physical exercise is one of the most important things you can do for your health. Most adults should get at least 150 minutes of moderate-intensity exercise (any activity that increases your heart rate and causes you to sweat) each week. In addition, most adults need muscle-strengthening exercises on 2 or more days a week.  Silver Sneakers may be a benefit available to you. To determine eligibility, you may visit the website: www.silversneakers.com or contact program at 561-820-9416 Mon-Fri between 8AM-8PM.   . Maintain a healthy weight. The body mass index (BMI) is a screening tool to identify possible weight problems. It provides an estimate of body fat based on height and weight. Your health care provider can find your BMI and can help you achieve or maintain a healthy weight.   For adults 20 years and older: ? A BMI below 18.5 is considered underweight. ? A BMI of 18.5 to 24.9 is normal. ? A BMI of 25 to 29.9 is considered overweight. ? A BMI of 30 and above is considered obese.   . Maintain normal blood lipids and cholesterol  levels by exercising and minimizing your intake of saturated fat. Eat a balanced diet with plenty of fruit and vegetables. Blood tests for lipids and cholesterol should begin at age 26 and be repeated every 5 years. If your lipid or cholesterol levels are high, you are over 50, or you are at high risk for heart disease, you may need your cholesterol levels checked more frequently. Ongoing high lipid and cholesterol levels should be treated with medicines if diet and exercise are not working.  . If you smoke, find out from your health care provider how to quit. If you do not use tobacco,  please do not start.  . If you choose to drink alcohol, please do not consume more than 2 drinks per day. One drink is considered to be 12 ounces (355 mL) of beer, 5 ounces (148 mL) of wine, or 1.5 ounces (44 mL) of liquor.  . If you are 69-24 years old, ask your health care provider if you should take aspirin to prevent strokes.  . Use sunscreen. Apply sunscreen liberally and repeatedly throughout the day. You should seek shade when your shadow is shorter than you. Protect yourself by wearing long sleeves, pants, a wide-brimmed hat, and sunglasses year round, whenever you are outdoors.  . Once a month, do a whole body skin exam, using a mirror to look at the skin on your back. Tell your health care provider of new moles, moles that have irregular borders, moles that are larger than a pencil eraser, or moles that have changed in shape or color.

## 2018-02-26 NOTE — Telephone Encounter (Signed)
Discussed further with Dr Oval Linsey and patient had actually completed Clopidogrel for 12 months post stent. No need to resume at this time. Patient aware, see phone note 02/16/18    Resume clopidogrel until she is going to have another procedure.    TCR  ----- Message -----  From: Annita Brod, RN  Sent: 02/15/2018 11:42 AM EST  To: Skeet Latch, MD, Earvin Hansen, LPN  Subject: Melton Alar: Non-Urgent Medical Question             ----- Message -----  From: Armandina Gemma  Sent: 02/15/2018 11:40 AM EST  To: [facility] Div Nl Triage  Subject: Non-Urgent Medical Question             Good Morning! I guess you received the report from the other cardio dr. He said I have to have 5 teeth removed before he will do the defibrillator. My question is because I stopped the plavix on the 14th of Jan. Should I go back and start taking it again for the time being? Just let me know. Thank you all and have a great day.

## 2018-02-26 NOTE — Progress Notes (Signed)
PCP notes:Last OV 02/19/2018   Health maintenance:  Health Maintenance Due  Topic Date Due  . FOOT EXAM -completed today 11/20/1960  . OPHTHALMOLOGY EXAM - placed a referral 11/20/1960  . TETANUS/TDAP -updated 11/20/1969  . MAMMOGRAM -provided handout to call and schedule 11/20/2000  . COLONOSCOPY -? Cologuard 11/20/2000  . DEXA SCAN -ordered today 11/21/2015  . PNA vac Low Risk Adult (1 of 2 - PCV13)-declined today 11/21/2015  . INFLUENZA VACCINE -declined 08/20/2017    Abnormal screenings: Hearing screening-referral placed PHQ9- Dr. Rogers Blocker is working with patient on medications/dosages, MMSE score of 29   Patient concerns: Patient deciding between remaining here in [place] or returning to [place]. She does not have the family support she thought she would have here.    Nurse concerns: See above   Next PCP appt: 04/09/2018

## 2018-03-01 ENCOUNTER — Encounter: Payer: Self-pay | Admitting: Family Medicine

## 2018-03-01 NOTE — Progress Notes (Signed)
I have personally reviewed the Medicare Annual Wellness questionnaire and have noted 1. The patient's medical and social history 2. Their use of alcohol, tobacco or illicit drugs 3. Their current medications and supplements 4. The patient's functional ability including ADL's, fall risks, home safety risks and hearing or visual impairment. 5. Diet and physical activities 6. Evidence for depression or mood disorders 7. Reviewed Updated provider list, see scanned forms and [facility] Snapshot.   The patients weight, height, BMI and visual acuity have been recorded in the chart I have made referrals, counseling and provided education to the patient based review of the above and I have provided the pt with a written personalized care plan for preventive services.  -agree with all HM that was ordered/updated.  Orma Flaming, MD [place]

## 2018-03-03 ENCOUNTER — Other Ambulatory Visit: Payer: Self-pay | Admitting: Family Medicine

## 2018-03-05 ENCOUNTER — Ambulatory Visit: Payer: Self-pay

## 2018-03-05 ENCOUNTER — Emergency Department (HOSPITAL_COMMUNITY)
Admission: EM | Admit: 2018-03-05 | Discharge: 2018-03-05 | Disposition: A | Discharge status: Home / Self Care | Payer: Medicare HMO | Attending: Emergency Medicine | Admitting: Emergency Medicine

## 2018-03-05 ENCOUNTER — Encounter (HOSPITAL_COMMUNITY): Payer: Self-pay | Admitting: Emergency Medicine

## 2018-03-05 ENCOUNTER — Other Ambulatory Visit: Payer: Self-pay

## 2018-03-05 DIAGNOSIS — Z951 Presence of aortocoronary bypass graft: Secondary | ICD-10-CM | POA: Diagnosis not present

## 2018-03-05 DIAGNOSIS — M549 Dorsalgia, unspecified: Secondary | ICD-10-CM | POA: Diagnosis not present

## 2018-03-05 DIAGNOSIS — M542 Cervicalgia: Secondary | ICD-10-CM | POA: Diagnosis not present

## 2018-03-05 DIAGNOSIS — Z7982 Long term (current) use of aspirin: Secondary | ICD-10-CM | POA: Diagnosis not present

## 2018-03-05 DIAGNOSIS — I110 Hypertensive heart disease with heart failure: Secondary | ICD-10-CM | POA: Insufficient documentation

## 2018-03-05 DIAGNOSIS — R1084 Generalized abdominal pain: Secondary | ICD-10-CM | POA: Diagnosis not present

## 2018-03-05 DIAGNOSIS — Z87891 Personal history of nicotine dependence: Secondary | ICD-10-CM | POA: Diagnosis not present

## 2018-03-05 DIAGNOSIS — E119 Type 2 diabetes mellitus without complications: Secondary | ICD-10-CM | POA: Insufficient documentation

## 2018-03-05 DIAGNOSIS — Z79899 Other long term (current) drug therapy: Secondary | ICD-10-CM | POA: Insufficient documentation

## 2018-03-05 DIAGNOSIS — R531 Weakness: Secondary | ICD-10-CM | POA: Diagnosis present

## 2018-03-05 DIAGNOSIS — M5489 Other dorsalgia: Secondary | ICD-10-CM | POA: Diagnosis not present

## 2018-03-05 DIAGNOSIS — I509 Heart failure, unspecified: Secondary | ICD-10-CM | POA: Diagnosis not present

## 2018-03-05 DIAGNOSIS — I2510 Atherosclerotic heart disease of native coronary artery without angina pectoris: Secondary | ICD-10-CM | POA: Diagnosis not present

## 2018-03-05 DIAGNOSIS — J101 Influenza due to other identified influenza virus with other respiratory manifestations: Secondary | ICD-10-CM | POA: Diagnosis not present

## 2018-03-05 DIAGNOSIS — Z794 Long term (current) use of insulin: Secondary | ICD-10-CM | POA: Diagnosis not present

## 2018-03-05 DIAGNOSIS — R0902 Hypoxemia: Secondary | ICD-10-CM | POA: Diagnosis not present

## 2018-03-05 DIAGNOSIS — E860 Dehydration: Secondary | ICD-10-CM | POA: Diagnosis not present

## 2018-03-05 DIAGNOSIS — R42 Dizziness and giddiness: Secondary | ICD-10-CM | POA: Diagnosis not present

## 2018-03-05 DIAGNOSIS — Z85828 Personal history of other malignant neoplasm of skin: Secondary | ICD-10-CM | POA: Diagnosis not present

## 2018-03-05 DIAGNOSIS — R1010 Upper abdominal pain, unspecified: Secondary | ICD-10-CM | POA: Diagnosis not present

## 2018-03-05 LAB — URINALYSIS, ROUTINE W REFLEX MICROSCOPIC
Bilirubin Urine: NEGATIVE
Glucose, UA: >=500 mg/dL — AB
Ketones, ur: 80 mg/dL — AB
Leukocytes,Ua: NEGATIVE
Nitrite: NEGATIVE
PH: 6 (ref 5.0–8.0)
Protein, ur: NEGATIVE mg/dL
Specific Gravity, Urine: 1.021 (ref 1.005–1.030)

## 2018-03-05 LAB — BASIC METABOLIC PANEL
Anion gap: 19 — ABNORMAL HIGH (ref 5–15)
BUN: 26 mg/dL — ABNORMAL HIGH (ref 8–23)
CO2: 16 mmol/L — ABNORMAL LOW (ref 22–32)
Calcium: 9.2 mg/dL (ref 8.9–10.3)
Chloride: 101 mmol/L (ref 98–111)
Creatinine, Ser: 1.65 mg/dL — ABNORMAL HIGH (ref 0.44–1.00)
GFR calc Af Amer: 37 mL/min — ABNORMAL LOW (ref >60)
GFR calc non Af Amer: 32 mL/min — ABNORMAL LOW (ref >60)
Glucose, Bld: 139 mg/dL — ABNORMAL HIGH (ref 70–99)
Potassium: 4.5 mmol/L (ref 3.5–5.1)
SODIUM: 136 mmol/L (ref 135–145)

## 2018-03-05 LAB — CBC
HCT: 46.1 % — ABNORMAL HIGH (ref 36.0–46.0)
Hemoglobin: 15.5 g/dL — ABNORMAL HIGH (ref 12.0–15.0)
MCH: 29.2 pg (ref 26.0–34.0)
MCHC: 33.6 g/dL (ref 30.0–36.0)
MCV: 87 fL (ref 80.0–100.0)
PLATELETS: 230 10*3/uL (ref 150–400)
RBC: 5.3 MIL/uL — ABNORMAL HIGH (ref 3.87–5.11)
RDW: 15.7 % — ABNORMAL HIGH (ref 11.5–15.5)
WBC: 4.9 10*3/uL (ref 4.0–10.5)
nRBC: 0 % (ref 0.0–0.2)

## 2018-03-05 LAB — CBG MONITORING, ED: Glucose-Capillary: 125 mg/dL — ABNORMAL HIGH (ref 70–99)

## 2018-03-05 LAB — INFLUENZA PANEL BY PCR (TYPE A & B)
Influenza A By PCR: POSITIVE — AB
Influenza B By PCR: NEGATIVE

## 2018-03-05 MED ORDER — SODIUM CHLORIDE 0.9% FLUSH: 3.0000 mL | Freq: Once | INTRAVENOUS | Status: DC

## 2018-03-05 MED ORDER — SODIUM CHLORIDE 0.9 % IV BOLUS
1000.0000 mL | Freq: Once | INTRAVENOUS | Status: AC
  Administered 2018-03-05: 1000 mL via INTRAVENOUS

## 2018-03-05 NOTE — ED Triage Notes (Signed)
Patient presents to the ED by EMS with c/o general weakness that increased today, has not been feeling well for the past 4 days. Increased neck and back pain. A/Ox4 NAD with EMS. Pt is from home. Denies dizziness or LOC.

## 2018-03-05 NOTE — ED Provider Notes (Signed)
[facility] EMERGENCY DEPARTMENT Provider Note   CSN: [id number] Arrival date & time: 03/05/18  1348     History   Chief Complaint Chief Complaint  Patient presents with  . Weakness    HPI Kara Williams is a 68 y.o. female.  HPI  68 yo female ho t2dm, copd presents today with generalized weakness.  She states she was sick this week with cough, nasal congestion, body aches, nausea, vomiting and diarrhea.  She felt somewhat better last night and was able to eat for the first time in several days, otherwise she  Had only been drinking water.  Today when she got up, she felt like her blood sugar was low when it was at 62.  She took 2 glucose tabs.  She got into the bathtub to feel better and then felt too weak to get out.  She states she did check her blood sugar again after she felt worse and it was up to 120.  She felt weak everywhere and felt like she could not get off of the floor.  She has a headache alert ice that she wears around her neck.  She called EMS.  She is feeling somewhat improved now.  She denies any headache or head injury.  She states the symptoms were everywhere.  She had some neck and back pain which has been similar to previous neck and back pain.  She has not had a fever that she knows of.  She denies having any similar episodes in the past.  She has had some decreased urine output with her illness.  She denies any chest pain, dyspnea, abdominal pain, or injury.  Denies vision changes or difficulty speaking.  She denies any history of stroke or symptoms or lateralized or focal today.  Past Medical History:  Diagnosis Date  . Arthritis   . Asthma   . Cancer (HCC)    Skin cancer/ basel cell benign   . Colon ulcer   . Depression   . Diabetes mellitus without complication ([place])   . Emphysema of lung ([place])   . Frequent headaches   . Heart disease   . Hyperlipidemia   . Hypertension   . Osteoarthritis   . Osteopenia   . Panic attack   . Psoriasis     . PTSD (post-traumatic stress disorder)    Needle Phobia  . Urine incontinence     Patient Active Problem List   Diagnosis Date Noted  . Chronic pain syndrome 02/02/2018  . Lumbar degenerative disc disease 02/02/2018  . Hx of CABG (5v) 02/02/2018  . Controlled substance agreement signed 02/02/2018  . Fibromyalgia 02/02/2018  . Benign essential HTN 12/10/2017  . CAD (coronary artery disease) 12/10/2017  . Diabetes mellitus without complication ([place]) [id number]  . Hyperlipidemia 12/10/2017  . CHF (congestive heart failure) ([place]) 12/10/2017  . Degenerative disc disease, cervical 12/10/2017  . PTSD (post-traumatic stress disorder) 12/10/2017  . COPD (chronic obstructive pulmonary disease) ([place]) 12/05/2017    Past Surgical History:  Procedure Laterality Date  . CARDIAC SURGERY  2019   Triple bypass  . CHOLECYSTECTOMY    . EYE SURGERY  2000  . LEG SURGERY  2016  . NECK SURGERY  2012  . TUBAL LIGATION  1975     OB History   No obstetric history on file.      Home Medications    Prior to Admission medications   Medication Sig Start Date End Date Taking? Authorizing Provider  ACCU-CHEK FASTCLIX  LANCETS MISC USE UP TO 4 TIMES A DAY AS DIRECTED 03/03/18   Orma Flaming, MD  albuterol (PROVENTIL) (2.5 MG/3ML) 0.083% nebulizer solution Take 2.5 mg by nebulization every 6 (six) hours as needed for wheezing or shortness of breath.    [provider]  ALPRAZolam Duanne Moron) 1 MG tablet Take 1 tablet (1 mg total) by mouth at bedtime as needed for anxiety. 12/10/17   Orma Flaming, MD  Ascorbic Acid (VITAMIN C) 1000 MG tablet Take 1,000 mg by mouth daily.    [provider]  aspirin EC 81 MG tablet Take 81 mg by mouth daily.    [provider]  atorvastatin (LIPITOR) 80 MG tablet Take 1 tablet (80 mg total) by mouth at bedtime. 12/10/17   Orma Flaming, MD  baclofen (LIORESAL) 10 MG tablet Take 1 tablet (10 mg total) by mouth 2 (two) times daily. 02/02/18    Gillis Santa, MD  blood glucose meter kit and supplies Dispense based on patient and insurance preference. Use up to four times daily as directed. (FOR ICD-10 E10.9, E11.9). 12/28/17   Orma Flaming, MD  Calcium Carb-Cholecalciferol (CALCIUM 1000 + D) 1000-800 MG-UNIT TABS Take 1 tablet by mouth 2 (two) times daily. 01/06/18   Orma Flaming, MD  carvedilol (COREG) 6.25 MG tablet Take 1 tablet (6.25 mg total) by mouth every 12 (twelve) hours. 12/10/17   Orma Flaming, MD  Cholecalciferol (VITAMIN D3 PO) Take 1,000 Units by mouth.    [provider]  citalopram (CELEXA) 20 MG tablet Take 1 tablet (20 mg total) by mouth daily. 02/19/18   Orma Flaming, MD  Diclofenac Sodium (PENNSAID) 2 % SOLN Place 0.5 g onto the skin 2 (two) times daily as needed. 02/02/18   Gillis Santa, MD  DM-APAP-CPM (CORICIDIN HBP FLU PO) Take by mouth.    [provider]  empagliflozin (JARDIANCE) 25 MG TABS tablet Take 25 mg by mouth daily. 01/08/18   Orma Flaming, MD  ENTRESTO 24-26 MG Take 1 tablet by mouth 2 (two) times daily. 12/31/17   Skeet Latch, MD  fluticasone [facility]) 50 MCG/ACT nasal spray Place 1 spray into both nostrils daily. 12/10/17   Orma Flaming, MD  glucose blood test strip USE UP TO 4 TIMES DAILY AS DIRECTED 01/21/18   Orma Flaming, MD  HYDROcodone-acetaminophen [facility]) 10-325 MG tablet Take 1 tablet by mouth every 8 (eight) hours as needed for up to 30 days for severe pain. 03/04/18 04/03/18  Gillis Santa, MD  Insulin Pen Needle (DROPLET PEN NEEDLES) 32G X 5 MM MISC by Does not apply route. 32 gauge x 5/32    [provider]  KLOR-CON M20 20 MEQ tablet TAKE 1 TABLET BY MOUTH EVERY DAY 02/01/18   Orma Flaming, MD  LANTUS SOLOSTAR 100 UNIT/ML Solostar Pen 10 Units daily.  10/22/17   [provider]  metFORMIN (GLUCOPHAGE) 1000 MG tablet Take 1 tablet (1,000 mg total) by mouth 2 (two) times daily with a meal. 01/08/18   Orma Flaming, MD  montelukast  (SINGULAIR) 10 MG tablet Take 1 tablet (10 mg total) by mouth at bedtime. 01/06/18   Orma Flaming, MD  MYRBETRIQ 25 MG TB24 tablet Take 1 tablet (25 mg total) by mouth daily. 12/10/17   Orma Flaming, MD  ondansetron (ZOFRAN ODT) 4 MG disintegrating tablet Take 1 tablet (4 mg total) by mouth every 8 (eight) hours as needed for nausea or vomiting. 12/25/17   Orma Flaming, MD  ondansetron (ZOFRAN) 4 MG tablet Take 4 mg  by mouth every 8 (eight) hours as needed for nausea or vomiting.    [provider]  pantoprazole (PROTONIX) 40 MG tablet Take 1 tablet (40 mg total) by mouth daily. 01/22/18   Orma Flaming, MD  PAZEO 0.7 % SOLN  02/08/18   [provider]  pregabalin (LYRICA) 150 MG capsule Take 1 capsule (150 mg total) by mouth 2 (two) times daily. 02/02/18   Gillis Santa, MD  SYMBICORT 160-4.5 MCG/ACT inhaler Inhale 2 puffs into the lungs 2 (two) times daily. Rinse mouth after each use 12/10/17   Orma Flaming, MD  VENTOLIN HFA 108 [phone] Base) MCG/ACT inhaler Inhale 1-2 puffs into the lungs every 6 (six) hours as needed for wheezing or shortness of breath. 12/10/17   Orma Flaming, MD    Family History Family History  Problem Relation Age of Onset  . Alcohol abuse Mother   . Arthritis Mother   . COPD Mother   . Depression Mother   . Drug abuse Mother   . Early death Mother   . Heart attack Mother   . Heart disease Mother   . Hyperlipidemia Mother   . Hypertension Mother   . Bipolar disorder Mother   . Miscarriages / [place] Mother   . Alcohol abuse Father   . Arthritis Father   . COPD Father   . Depression Father   . Drug abuse Father   . Early death Father   . Heart disease Father   . Hyperlipidemia Father   . Hypertension Father   . Heart attack Father   . Bipolar disorder Father   . Arthritis Sister   . Asthma Sister   . Depression Sister   . COPD Sister   . Diabetes Sister   . Heart attack Sister   . Heart disease Sister   . Hyperlipidemia  Sister   . Hypertension Sister   . Bipolar disorder Sister   . Asthma Maternal Grandmother   . COPD Maternal Grandmother   . Diabetes Maternal Grandmother   . Hyperlipidemia Maternal Grandmother   . Heart disease Maternal Grandmother   . Hypertension Maternal Grandmother   . Heart attack Maternal Grandmother   . Asthma Maternal Grandfather   . COPD Maternal Grandfather   . Diabetes Maternal Grandfather   . Heart disease Maternal Grandfather   . Hyperlipidemia Maternal Grandfather   . Hypertension Maternal Grandfather   . Heart attack Maternal Grandfather   . Asthma Paternal Grandmother   . COPD Paternal Grandmother   . Diabetes Paternal Grandmother   . Heart disease Paternal Grandmother   . Hypertension Paternal Grandmother   . Hyperlipidemia Paternal Grandmother   . Heart attack Paternal Grandmother   . Asthma Paternal Grandfather   . COPD Paternal Grandfather   . Diabetes Paternal Grandfather   . Heart disease Paternal Grandfather   . Hyperlipidemia Paternal Grandfather   . Hypertension Paternal Grandfather   . Heart attack Paternal Grandfather     Social History Social History   Tobacco Use  . Smoking status: Former Smoker    Last attempt to quit: 01/20/1978    Years since quitting: 40.1  . Smokeless tobacco: Never Used  Substance Use Topics  . Alcohol use: Never    Frequency: Never  . Drug use: Never     Allergies   Erythromycin; Penicillins; Sulfa antibiotics; Lactose intolerance (gi); Cortisone; and Tetracyclines & related   Review of Systems Review of Systems  All other systems reviewed and are negative.  Physical Exam Updated Vital Signs BP 115/66 (BP Location: Right Arm)   Pulse 84   Temp 97.8 F (36.6 C) (Oral)   Resp (!) 24   LMP  (LMP Unknown)   SpO2 96%   Physical Exam Vitals signs and nursing note reviewed.  Constitutional:      General: She is in acute distress.     Appearance: Normal appearance. She is normal weight. She is not  ill-appearing.  HENT:     Head: Normocephalic and atraumatic.     Right Ear: Tympanic membrane and external ear normal.     Left Ear: Tympanic membrane and external ear normal.     Nose: Nose normal.     Mouth/Throat:     Mouth: Mucous membranes are moist.     Pharynx: Oropharynx is clear.  Eyes:     Extraocular Movements: Extraocular movements intact.     Pupils: Pupils are equal, round, and reactive to light.  Neck:     Musculoskeletal: Normal range of motion and neck supple.  Cardiovascular:     Rate and Rhythm: Normal rate and regular rhythm.     Pulses: Normal pulses.     Heart sounds: Normal heart sounds.  Pulmonary:     Effort: Pulmonary effort is normal.     Breath sounds: Normal breath sounds.  Abdominal:     General: Abdomen is flat.     Palpations: Abdomen is soft.  Musculoskeletal: Normal range of motion.  Skin:    General: Skin is warm and dry.     Capillary Refill: Capillary refill takes less than 2 seconds.  Neurological:     General: No focal deficit present.     Mental Status: She is alert and oriented to person, place, and time. Mental status is at baseline.     Cranial Nerves: No cranial nerve deficit.     Sensory: No sensory deficit.     Motor: No weakness.     Coordination: Coordination normal.     Deep Tendon Reflexes: Reflexes normal.  Psychiatric:        Mood and Affect: Mood normal.      ED Treatments / Results  Labs (all labs ordered are listed, but only abnormal results are displayed) Labs Reviewed  BASIC METABOLIC PANEL - Abnormal; Notable for the following components:      Result Value   CO2 16 (*)    Glucose, Bld 139 (*)    BUN 26 (*)    Creatinine, Ser 1.65 (*)    GFR calc non Af Amer 32 (*)    GFR calc Af Amer 37 (*)    Anion gap 19 (*)    All other components within normal limits  CBC - Abnormal; Notable for the following components:   RBC 5.30 (*)    Hemoglobin 15.5 (*)    HCT 46.1 (*)    RDW 15.7 (*)    All other  components within normal limits  CBG MONITORING, ED - Abnormal; Notable for the following components:   Glucose-Capillary 125 (*)    All other components within normal limits  URINALYSIS, ROUTINE W REFLEX MICROSCOPIC  INFLUENZA PANEL BY PCR (TYPE A & B)    EKG EKG Interpretation  Date/Time:  Friday March 05 2018 14:00:28 EST Ventricular Rate:  84 PR Interval:    QRS Duration: 136 QT Interval:  407 QTC Calculation: 482 R Axis:   63 Text Interpretation:  Sinus rhythm LVH with secondary repolarization abnormality No old tracing to  compare Confirmed by Daleen Bo [phone]) on 03/05/2018 3:23:46 PM   Radiology No results found.  Procedures Procedures (including critical care time)  Medications Ordered in ED Medications  sodium chloride flush (NS) 0.9 % injection 3 mL (has no administration in time range)  sodium chloride 0.9 % bolus 1,000 mL (has no administration in time range)     Initial Impression / Assessment and Plan / ED Course  I have reviewed the triage vital signs and the nursing notes.  Pertinent labs & imaging results that were available during my care of the patient were reviewed by me and considered in my medical decision making (see chart for details).    67 year old female presents today with generalized weakness and cough, myalgias, vomiting, and diarrhea for several days.  Patient is flu A+ here.  However, she has had symptoms since Monday.  We discussed Tamiflu but patient is unlikely to benefit at this stage of her illness.  She is given IV fluids here in the emergency department.  She has had vomiting and diarrhea at home.  She is not vomiting since yesterday and has tolerated p.o. without difficulty here.  She is received 2 L of normal saline.  She has been ambulating without difficulty and is discharged to home in improved condition. Final Clinical Impressions(s) / ED Diagnoses   Final diagnoses:  Influenza A  Dehydration    ED Discharge Orders      None       Pattricia Boss, MD 03/05/18 2031

## 2018-03-05 NOTE — ED Notes (Signed)
Now reports she is taking celexa which is new to her, she had a cough. Tried to get to the shower and sat in it to feel better, states "I could not get myself up out of the shower with my own strength, I finally got myself up and was able to get to the bed room." currently no pain unless with movement, then neck and back hurts.

## 2018-03-05 NOTE — ED Notes (Signed)
Patient verbalizes understanding of discharge instructions. Opportunity for questioning and answers were provided. Armband removed by staff, pt discharged from ED via wheelchair to home.  

## 2018-03-05 NOTE — Telephone Encounter (Signed)
Pt. Reports she has been sick this week with vomiting and diarrhea, which has stopped. Reports she is so weak she sat in the bathtub for 2 hours this morning because she was too weak to get up and was having back and neck pain.Reports she ate last night and felt better. But has not eaten today and she is diabetic. Blood glucose this morning was 64. Pt. Is currently in her bed and her daughter is on her way. Instructed pt. To call EMS/911 since she is too weak to get out of bed. Verbalizes understanding. States she will call her daughter again too. Pt. Was able to recheck glucose - 162.  Reason for Disposition . [1] SEVERE weakness (i.e., unable to walk or barely able to walk, requires support) AND     [2] new onset or worsening  Answer Assessment - Initial Assessment Questions 1. DESCRIPTION: "Describe how you are feeling."     Weak, dizzy 2. SEVERITY: "How bad is it?"  "Can you stand and walk?"   - MILD - Feels weak or tired, but does not interfere with work, school or normal activities   - [place] to stand and walk; weakness interferes with work, school, or normal activities   - SEVERE - Unable to stand or walk     Severe 3. ONSET:  "When did the weakness begin?"      This week 4. CAUSE: "What do you think is causing the weakness?"     Has had vomiting and diarrhea 5. MEDICINES: "Have you recently started a new medicine or had a change in the amount of a medicine?"     No 6. OTHER SYMPTOMS: "Do you have any other symptoms?" (e.g., chest pain, fever, cough, SOB, vomiting, diarrhea, bleeding, other areas of pain)     Cough,vomiting,diarrhea 7. PREGNANCY: "Is there any chance you are pregnant?" "When was your last menstrual period?"     No  Protocols used: WEAKNESS (GENERALIZED) AND FATIGUE-A-AH

## 2018-03-05 NOTE — ED Notes (Signed)
Pt given water and crackers to eat

## 2018-03-05 NOTE — Telephone Encounter (Signed)
Agree. Advised she come in at beginning of week for appointment, but has transportation issues. Agree with ER.

## 2018-03-05 NOTE — Telephone Encounter (Signed)
See note

## 2018-03-05 NOTE — Discharge Instructions (Addendum)
Please drink plenty of fluids.  Recheck Monday with your doctor and have your creatinine rechecked.

## 2018-03-06 ENCOUNTER — Other Ambulatory Visit: Payer: Self-pay | Admitting: Family Medicine

## 2018-03-08 ENCOUNTER — Other Ambulatory Visit: Payer: Self-pay | Admitting: Family Medicine

## 2018-03-08 DIAGNOSIS — Z1231 Encounter for screening mammogram for malignant neoplasm of breast: Secondary | ICD-10-CM

## 2018-03-10 ENCOUNTER — Telehealth: Payer: Self-pay | Admitting: Family Medicine

## 2018-03-10 ENCOUNTER — Other Ambulatory Visit: Payer: Self-pay

## 2018-03-10 DIAGNOSIS — Z01818 Encounter for other preprocedural examination: Secondary | ICD-10-CM

## 2018-03-10 NOTE — Telephone Encounter (Signed)
See note  Copied from [place] [id number]. Topic: General - Inquiry >> Mar 10, 2018  1:25 PM Ahmed Prima L wrote: Reason for CRM: patient states she is going to have a dental procedure done and they are needing the INR number before they can schedule her. Please Advise.

## 2018-03-10 NOTE — Telephone Encounter (Signed)
Called and spoke with patient and she needs to have an INR drawn prior to having some teeth pulled.  She already has an 11:20 appt scheduled tomorrow 2/20 w/Dr. Rogers Blocker, will draw labs then.  Pt verbalized understanding.

## 2018-03-11 ENCOUNTER — Encounter: Payer: Self-pay | Admitting: Family Medicine

## 2018-03-11 ENCOUNTER — Ambulatory Visit (INDEPENDENT_AMBULATORY_CARE_PROVIDER_SITE_OTHER): Payer: Medicare HMO | Admitting: Family Medicine

## 2018-03-11 VITALS — BP 150/94 | HR 93 | Temp 97.6°F | Ht 64.0 in | Wt 160.6 lb

## 2018-03-11 DIAGNOSIS — R112 Nausea with vomiting, unspecified: Secondary | ICD-10-CM

## 2018-03-11 DIAGNOSIS — L821 Other seborrheic keratosis: Secondary | ICD-10-CM | POA: Diagnosis not present

## 2018-03-11 DIAGNOSIS — F418 Other specified anxiety disorders: Secondary | ICD-10-CM | POA: Diagnosis not present

## 2018-03-11 DIAGNOSIS — Z01818 Encounter for other preprocedural examination: Secondary | ICD-10-CM

## 2018-03-11 DIAGNOSIS — R6889 Other general symptoms and signs: Secondary | ICD-10-CM | POA: Diagnosis not present

## 2018-03-11 LAB — PROTIME-INR
INR: 1 ratio (ref 0.8–1.0)
Prothrombin Time: 11.3 s (ref 9.6–13.1)

## 2018-03-11 MED ORDER — FLUOXETINE HCL 20 MG PO CAPS: 20.0000 mg | ORAL_CAPSULE | Freq: Every day | ORAL | 0 refills | Status: DC

## 2018-03-11 MED ORDER — HYDROXYZINE HCL 25 MG PO TABS: 25.0000 mg | ORAL_TABLET | Freq: Three times a day (TID) | ORAL | 1 refills | Status: DC | PRN

## 2018-03-11 MED ORDER — EMPAGLIFLOZIN 25 MG PO TABS: 25.0000 mg | ORAL_TABLET | Freq: Every day | ORAL | 3 refills | Status: AC

## 2018-03-11 NOTE — Progress Notes (Signed)
Patient: Kara Williams MRN: [id number] DOB: Jun 02, 1950 PCP: Orma Flaming, MD     Subjective:  Chief Complaint  Patient presents with  . appetite issues    HPI: The patient is a 68 y.o. female who presents today for nausea/appetite issues. She started to have nausea when she started the celexa. She has tried to take it in the AM and Pm and no matter when she takes it she gets nauseated. It was helping with her anxiety. She was on it before, but wasn't on all of these other medications. She has been on numerous anti anxiety medications.   Is also needing an INR for her dental surgery and will do this today.   She also complains of itching all over her body with little scabs. She has tried eucerin, aveeno creams. Allergy to cortisone and steroid creams.   Review of Systems  Constitutional: Positive for appetite change and fatigue.       Huge decrease in appetite and has lost almost 10 lbs  Respiratory: Positive for cough and shortness of breath.   Cardiovascular: Negative for chest pain.  Gastrointestinal: Negative for abdominal pain and nausea.  Musculoskeletal: Positive for back pain.       Back and ribs are very sore from excessive coughing  Neurological: Positive for light-headedness.  Psychiatric/Behavioral: Positive for sleep disturbance.    Allergies Patient is allergic to erythromycin; penicillins; sulfa antibiotics; lactose intolerance (gi); cortisone; and tetracyclines & related.  Past Medical History Patient  has a past medical history of Arthritis, Asthma, Cancer ([place]), Colon ulcer, Depression, Diabetes mellitus without complication ([place]), Emphysema of lung ([place]), Frequent headaches, Heart disease, Hyperlipidemia, Hypertension, Osteoarthritis, Osteopenia, Panic attack, Psoriasis, PTSD (post-traumatic stress disorder), and Urine incontinence.  Surgical History Patient  has a past surgical history that includes Tubal ligation (1975); Cholecystectomy; Neck surgery (2012);  Leg Surgery (2016); Cardiac surgery (2019); and Eye surgery (2000).  Family History Pateint's family history includes Alcohol abuse in her father and mother; Arthritis in her father, mother, and sister; Asthma in her maternal grandfather, maternal grandmother, paternal grandfather, paternal grandmother, and sister; Bipolar disorder in her father, mother, and sister; COPD in her father, maternal grandfather, maternal grandmother, mother, paternal grandfather, paternal grandmother, and sister; Depression in her father, mother, and sister; Diabetes in her maternal grandfather, maternal grandmother, paternal grandfather, paternal grandmother, and sister; Drug abuse in her father and mother; Early death in her father and mother; Heart attack in her father, maternal grandfather, maternal grandmother, mother, paternal grandfather, paternal grandmother, and sister; Heart disease in her father, maternal grandfather, maternal grandmother, mother, paternal grandfather, paternal grandmother, and sister; Hyperlipidemia in her father, maternal grandfather, maternal grandmother, mother, paternal grandfather, paternal grandmother, and sister; Hypertension in her father, maternal grandfather, maternal grandmother, mother, paternal grandfather, paternal grandmother, and sister; Miscarriages / [place] in her mother.  Social History Patient  reports that she quit smoking about 40 years ago. She has never used smokeless tobacco. She reports that she does not drink alcohol or use drugs.    Objective: Vitals:   03/11/18 1053  BP: (!) 150/94  Pulse: 93  Temp: 97.6 F (36.4 C)  TempSrc: Oral  SpO2: 97%  Weight: 160 lb 9.6 oz (72.8 kg)  Height: 5\' 4"  (1.626 m)    Body mass index is 27.57 kg/m.  Physical Exam Vitals signs reviewed.  Constitutional:      Appearance: Normal appearance.  HENT:     Right Ear: Tympanic membrane, ear canal and external ear normal.  Left Ear: Tympanic membrane, ear canal and  external ear normal.  Neck:     Musculoskeletal: Normal range of motion and neck supple.  Cardiovascular:     Rate and Rhythm: Normal rate and regular rhythm.     Heart sounds: Normal heart sounds.  Pulmonary:     Effort: Pulmonary effort is normal. No respiratory distress.     Breath sounds: Normal breath sounds. No wheezing or rales.  Abdominal:     General: Abdomen is flat. Bowel sounds are normal.     Palpations: Abdomen is soft.  Skin:    Capillary Refill: Capillary refill takes less than 2 seconds.     Comments: Seborrheic keratosis that are dry and irritated all over arms/legs/hands.   Neurological:     General: No focal deficit present.     Mental Status: She is alert and oriented to person, place, and time.        Assessment/plan: 1. Pre-operative exam INR today. I actually don't think she needs this, but she told [employer] she was on blood thinner, which she is not. Will go ahead and get this done in case oral max wants this. Up to date on other lab work. Is needing this procedure for her heart. They are requesting clearance for surgery. Will discuss with her cardiologist.  - Protime-INR  2. Anxiety with depression Stopping celexa. I think this is contributing to her nausea as well or is the primary culprit. Im going to change her to prozac. She has never tried this before so will start low dose and then see her back in 1 month. She is to call me or email if any issues before that time. SI/HI precautions again given.   3. Non-intractable vomiting with nausea, unspecified vomiting type Likely secondary to the celexa since it all started when she started this medication. The flu only made it worse.   4. Seborrheic keratosis -can not do topical steroids. Will do trial of oral hydroxyzine. Fall precaution/drowsy precautions given. Could freeze, but too numerous.   Return in about 1 month (around 04/09/2018) for anxiety/depression .    Orma Flaming, MD [place]   03/11/2018

## 2018-03-11 NOTE — Patient Instructions (Signed)
We are going to stop the celexa and start prozac. You can just stop and start the prozac the next day. im putting you on a low dose of prozac. email me if any issues, otherwise I will see you in 1 month.   - ill need the oral maxillary surgeon's office number to fax over lab result  -let me know if nausea doesn't resolve  -I want you walking, deep breathing to help keep those lungs open following the flu .

## 2018-03-13 ENCOUNTER — Other Ambulatory Visit: Payer: Self-pay | Admitting: Family Medicine

## 2018-03-13 DIAGNOSIS — R059 Cough, unspecified: Secondary | ICD-10-CM

## 2018-03-13 DIAGNOSIS — R05 Cough: Secondary | ICD-10-CM

## 2018-03-15 MED ORDER — MONTELUKAST SODIUM 10 MG PO TABS: 10.0000 mg | ORAL_TABLET | Freq: Every day | ORAL | 3 refills | Status: AC

## 2018-03-18 ENCOUNTER — Telehealth: Payer: Self-pay | Admitting: Family Medicine

## 2018-03-18 NOTE — Telephone Encounter (Signed)
Copied from [place] [id number]. Topic: Quick Communication - See Telephone Encounter >> Mar 18, 2018  3:53 PM Bea Graff, NT wrote: CRM for notification. See Telephone encounter for: 03/18/18. Pt states that the oral surgeon has not received her INR result and she is checking status on this being sent to them.

## 2018-03-19 ENCOUNTER — Telehealth: Payer: Self-pay

## 2018-03-19 NOTE — Telephone Encounter (Signed)
INR labs and office notes faxed to Dr. Janine Limbo office

## 2018-03-19 NOTE — Telephone Encounter (Signed)
See note

## 2018-03-19 NOTE — Telephone Encounter (Signed)
Pt reports that she has not had teeth removed yet.  States she has been going to hoops with insurance and dentist. She is hoping procedure will be 2nd/3rd week in March. Pt informed that I would follow up end of March to check on status of things. Pt agreeable to plan.

## 2018-03-19 NOTE — Telephone Encounter (Signed)
INR labs and office notes faxed to office of Dr. Varney Biles

## 2018-03-23 DIAGNOSIS — Z1211 Encounter for screening for malignant neoplasm of colon: Secondary | ICD-10-CM | POA: Diagnosis not present

## 2018-03-25 ENCOUNTER — Telehealth: Payer: Self-pay | Admitting: Cardiology

## 2018-03-25 NOTE — Telephone Encounter (Signed)
Pt is having 5  teeth removed and the Oral Surgeon does not require that the pt needs antibiotics beforehand.   Pt has a card that says she needs an antibiotic before dental work. Would like to clarify orders before procedure.

## 2018-03-25 NOTE — Telephone Encounter (Signed)
Called and spoke to patient. She states that she is ready to move forward with having her 5 teeth removed so that she can proceed with having a defibrillator placed. She states that the Oral Surgeon Dr. Dierdre Harness at [facility] is telling her that she does not require abx prior to her procedure that is tentatively scheduled for 04/06/18. Patient likely had transient bacteremia after a dental cleaning in the past and does require antibiotic prophylaxis . Patient has multiple allergies including PCN, erythromycin, and sulfa. Will forward to Dr. Oval Linsey and PharmD for review and recommendation on abx.

## 2018-03-26 LAB — COLOGUARD: Cologuard: NEGATIVE

## 2018-03-26 MED ORDER — CLINDAMYCIN HCL 150 MG PO CAPS: 600.0000 mg | ORAL_CAPSULE | Freq: Once | ORAL | 1 refills | Status: AC

## 2018-03-26 NOTE — Telephone Encounter (Signed)
Informed that I would discuss w/ Camnitz and let her know in several weeks (pt made aware we would be out of the office most of next week). Patient verbalized understanding and agreeable to plan.

## 2018-03-26 NOTE — Telephone Encounter (Signed)
Pt updated and order placed. Pt also inquiring as to when she is to have defibrillator placed after dental procedure. Will route to MD and nurse.

## 2018-03-26 NOTE — Telephone Encounter (Signed)
Left message to call back  

## 2018-03-26 NOTE — Telephone Encounter (Signed)
Follow Up:; ° ° °Returning your call. °

## 2018-03-26 NOTE — Telephone Encounter (Signed)
She does need dental prophylaxis per Dr. Oval Linsey chart note.   With pcn allergy, give her clindamycin 150 mg - take 4 capsules 1 hour prior to dental appointment.  Give her 12 capsules with 1 refill so she won't have to call every time she has a dental appointment

## 2018-03-29 ENCOUNTER — Encounter: Payer: Self-pay | Admitting: Family Medicine

## 2018-03-30 ENCOUNTER — Other Ambulatory Visit: Payer: Self-pay

## 2018-03-30 ENCOUNTER — Ambulatory Visit: Payer: Medicare HMO | Attending: Nurse Practitioner | Admitting: Nurse Practitioner

## 2018-03-30 ENCOUNTER — Other Ambulatory Visit: Payer: Self-pay | Admitting: Student in an Organized Health Care Education/Training Program

## 2018-03-30 ENCOUNTER — Encounter: Payer: Self-pay | Admitting: Physician Assistant

## 2018-03-30 ENCOUNTER — Encounter: Payer: Self-pay | Admitting: Nurse Practitioner

## 2018-03-30 VITALS — BP 112/68 | HR 72 | Temp 98.2°F | Resp 16 | Ht 64.0 in | Wt 160.0 lb

## 2018-03-30 DIAGNOSIS — G894 Chronic pain syndrome: Secondary | ICD-10-CM | POA: Diagnosis not present

## 2018-03-30 DIAGNOSIS — M5136 Other intervertebral disc degeneration, lumbar region: Secondary | ICD-10-CM | POA: Insufficient documentation

## 2018-03-30 DIAGNOSIS — Z79891 Long term (current) use of opiate analgesic: Secondary | ICD-10-CM | POA: Diagnosis not present

## 2018-03-30 DIAGNOSIS — M5030 Other cervical disc degeneration, unspecified cervical region: Secondary | ICD-10-CM | POA: Diagnosis not present

## 2018-03-30 DIAGNOSIS — M797 Fibromyalgia: Secondary | ICD-10-CM | POA: Insufficient documentation

## 2018-03-30 DIAGNOSIS — R6889 Other general symptoms and signs: Secondary | ICD-10-CM | POA: Diagnosis not present

## 2018-03-30 MED ORDER — BACLOFEN 10 MG PO TABS: 10.0000 mg | ORAL_TABLET | Freq: Two times a day (BID) | ORAL | 1 refills | Status: AC

## 2018-03-30 MED ORDER — HYDROCODONE-ACETAMINOPHEN 10-325 MG PO TABS: 1.0000 | ORAL_TABLET | Freq: Three times a day (TID) | ORAL | 0 refills | Status: DC | PRN

## 2018-03-30 MED ORDER — PREGABALIN 150 MG PO CAPS: 150.0000 mg | ORAL_CAPSULE | Freq: Two times a day (BID) | ORAL | 1 refills | Status: AC

## 2018-03-30 MED ORDER — DICLOFENAC SODIUM 2 % TD SOLN: 0.5000 g | Freq: Two times a day (BID) | TRANSDERMAL | 1 refills | Status: DC | PRN

## 2018-03-30 NOTE — Progress Notes (Signed)
Patient's Name: Kara Williams  MRN: [id number]  Referring Provider: Orma Flaming, MD  DOB: 02/19/1950  PCP: Orma Flaming, MD  DOS: 03/30/2018  Note by: Dionisio David, NP  Service setting: Ambulatory outpatient  Specialty: Interventional Pain Management  Location: [facility] (AMB) Pain Management Facility    Patient type: Established   HPI  Reason for Visit: Kara Williams is a 68 y.o. year old, female patient, who comes today with a chief complaint of Fibromyalgia; Back Pain (lower); and Neck Pain Last Appointment: Her last appointment at our practice was on 02/10/2018. I last saw her on 02/10/2018.  Pain Assessment: Today, Kara Williams describes the severity of the Chronic pain as a 8 /10. She indicates the location/referral of the pain to be Back Right, Left, Lower/hips down back of legs to the heel of feet. Onset was: More than a month ago. The quality of pain is described as Aching, Constant, Burning, Sharp, Throbbing. Temporal description, or timing of pain is: Constant. Possible modifying factors: heat, rest, medications. Kara Williams  height is '5\' 4"'  (1.626 m) and weight is 160 lb (72.6 kg). Her temperature is 98.2 F (36.8 C). Her blood pressure is 112/68 and her pulse is 72. Her respiration is 16 and oxygen saturation is 99%.  She has numbness and tingling in her legs. She has weakness. She falls because of the pain. She admits that she has had PT last in 2019 after her heart attack. She denies any new concerns today. She is goingn to have tooth extraction x 5 on next week. She is also going to have a defibrillator placed.   Controlled Substance Pharmacotherapy Assessment REMS (Risk Evaluation and Mitigation Strategy)  Analgesic: Hydrocodone 10 mg 3 times daily as needed MME: 30 mg / day Ignatius Specking, RN  03/30/2018 11:38 AM  Sign when Signing Visit Nursing Pain Medication Assessment:  Safety precautions to be maintained throughout the outpatient stay will include: orient to surroundings,  keep bed in low position, maintain call bell within reach at all times, provide assistance with transfer out of bed and ambulation.  Medication Inspection Compliance: Pill count conducted under aseptic conditions, in front of the patient. Neither the pills nor the bottle was removed from the patient's sight at any time. Once count was completed pills were immediately returned to the patient in their original bottle.  Medication #1: Hydrocodone/APAP Pill/Patch Count: 24 of 90 pills remain Pill/Patch Appearance: Markings consistent with prescribed medication Bottle Appearance: Standard pharmacy container. Clearly labeled. Filled Date: 2 / 17 / 2020 Last Medication intake:  Yesterday  Medication #2: baclofen Pill/Patch Count: 0 of 60 pills remain Pill/Patch Appearance: Markings consistent with prescribed medication Bottle Appearance: Standard pharmacy container. Clearly labeled. Filled Date: 74 / 21 / 2019 Last Medication intake:  Ran out of medicine more than 48 hours ago   Pharmacokinetics: Liberation and absorption (onset of action): WNL Distribution (time to peak effect): WNL Metabolism and excretion (duration of action): WNL         Pharmacodynamics: Desired effects: Analgesia: [name] reports >50% benefit. Functional ability: Patient reports that medication allows her to accomplish basic ADLs Clinically meaningful improvement in function (CMIF): Sustained CMIF goals met Perceived effectiveness: Described as relatively effective, allowing for increase in activities of daily living (ADL) Undesirable effects: Side-effects or Adverse reactions: None reported Monitoring: [place] PMP: Online review of the past 79-monthperiod conducted. Compliant with practice rules and regulations Last UDS on record: Summary  Date Value Ref Range Status  01/07/2018  FINAL  Final    Comment:    ==================================================================== TOXASSURE COMP DRUG  ANALYSIS,UR ==================================================================== Test                             Result       Flag       Units Drug Present and Declared for Prescription Verification   Baclofen                       PRESENT      EXPECTED Drug Present not Declared for Prescription Verification   Chlorpheniramine               PRESENT      UNEXPECTED Drug Absent but Declared for Prescription Verification   Alprazolam                     Not Detected UNEXPECTED ng/mg creat   Hydrocodone                    Not Detected UNEXPECTED ng/mg creat   Pregabalin                     Not Detected UNEXPECTED   Acetaminophen                  Not Detected UNEXPECTED    Acetaminophen, as indicated in the declared medication list, is    not always detected even when used as directed.   Salicylate                     Not Detected UNEXPECTED    Aspirin, as indicated in the declared medication list, is not    always detected even when used as directed. ==================================================================== Test                      Result    Flag   Units      Ref Range   Creatinine              399              mg/dL      >=20 ==================================================================== Declared Medications:  The flagging and interpretation on this report are based on the  following declared medications.  Unexpected results may arise from  inaccuracies in the declared medications.  **Note: The testing scope of this panel includes these medications:  Alprazolam  Baclofen  Hydrocodone (Norco)  Pregabalin (Lyrica)  **Note: The testing scope of this panel does not include small to  moderate amounts of these reported medications:  Acetaminophen (Norco)  Aspirin (Aspirin 81)  **Note: The testing scope of this panel does not include following  reported medications:  Albuterol  Albuterol (Ventolin HFA)  Atorvastatin  Benzonatate  Budesonide (Symbicort)  Calcium   Cholecalciferol  Clopidogrel  Fluticasone  Formoterol (Symbicort)  Insulin (Lantus)  Insulin (NovoLog)  Mirabegron (Myrbetriq)  Montelukast (Singulair)  Ondansetron (Zofran)  Oseltamivir (Tamiflu)  Potassium (K-Dur)  Ranitidine  Sacubitril (Entresto)  Valsartan (Entresto)  Vitamin C ==================================================================== For clinical consultation, please call [phone]. ====================================================================    UDS interpretation: Compliant          Medication Assessment Form: Reviewed. Patient indicates being compliant with therapy Treatment compliance: Compliant Risk Assessment Profile: Aberrant behavior: See initial evaluations. None observed or detected today Comorbid factors increasing risk of overdose: See initial evaluation. No  additional risks detected today Opioid risk tool (ORT):  Opioid Risk  03/30/2018  Alcohol 1  Illegal Drugs 2  Rx Drugs 4  Alcohol 0  Illegal Drugs 0  Rx Drugs 0  Age between 16-45 years  0  History of Preadolescent Sexual Abuse 3  Psychological Disease 2  ADD Negative  OCD Negative  Bipolar Negative  Depression 1  Opioid Risk Tool Scoring 13  Opioid Risk Interpretation High Risk    ORT Scoring interpretation table:  Score <3 = Low Risk for SUD  Score between 4-7 = Moderate Risk for SUD  Score >8 = High Risk for Opioid Abuse   Risk of substance use disorder (SUD): High  Risk Mitigation Strategies:  Patient Counseling: Covered Patient-Prescriber Agreement (PPA): Present and active  Notification to other healthcare providers: Done  Pharmacologic Plan: No change in therapy, at this time.             ROS  Constitutional: Denies any fever or chills Gastrointestinal: No reported hemesis, hematochezia, vomiting, or acute GI distress Musculoskeletal: Denies any acute onset joint swelling, redness, loss of ROM, or weakness Neurological: No reported episodes of acute  onset apraxia, aphasia, dysarthria, agnosia, amnesia, paralysis, loss of coordination, or loss of consciousness  Medication Review  ALPRAZolam, Accu-Chek FastClix Lancets, Calcium Carb-Cholecalciferol, Cholecalciferol, Diclofenac Sodium, FLUoxetine, HYDROcodone-acetaminophen, Insulin Glargine, Insulin Pen Needle, Olopatadine HCl, albuterol, aspirin EC, atorvastatin, baclofen, blood glucose meter kit and supplies, budesonide-formoterol, carvedilol, empagliflozin, fluticasone, glucose blood, hydrOXYzine, metFORMIN, mirabegron ER, montelukast, ondansetron, pantoprazole, potassium chloride SA, pregabalin, sacubitril-valsartan, and vitamin C  History Review  Allergy: Ms. Caulfield is allergic to erythromycin; penicillins; sulfa antibiotics; lactose intolerance (gi); cortisone; and tetracyclines & related. Drug: Ms. Stice  reports no history of drug use. Alcohol:  reports no history of alcohol use. Tobacco:  reports that she quit smoking about 40 years ago. She has never used smokeless tobacco. Social: Ms. Gural  reports that she quit smoking about 40 years ago. She has never used smokeless tobacco. She reports that she does not drink alcohol or use drugs. Medical:  has a past medical history of Arthritis, Asthma, Cancer ([place]), Colon ulcer, Depression, Diabetes mellitus without complication ([place]), Emphysema of lung ([place]), Frequent headaches, Heart disease, Hyperlipidemia, Hypertension, Loss of teeth due to extraction, Osteoarthritis, Osteopenia, Panic attack, Psoriasis, PTSD (post-traumatic stress disorder), and Urine incontinence. Surgical: Ms. Jeanmarie  has a past surgical history that includes Tubal ligation (1975); Cholecystectomy; Neck surgery (2012); Leg Surgery (2016); Cardiac surgery (2019); and Eye surgery (2000). Family: family history includes Alcohol abuse in her father and mother; Arthritis in her father, mother, and sister; Asthma in her maternal grandfather, maternal grandmother, paternal  grandfather, paternal grandmother, and sister; Bipolar disorder in her father, mother, and sister; COPD in her father, maternal grandfather, maternal grandmother, mother, paternal grandfather, paternal grandmother, and sister; Depression in her father, mother, and sister; Diabetes in her maternal grandfather, maternal grandmother, paternal grandfather, paternal grandmother, and sister; Drug abuse in her father and mother; Early death in her father and mother; Heart attack in her father, maternal grandfather, maternal grandmother, mother, paternal grandfather, paternal grandmother, and sister; Heart disease in her father, maternal grandfather, maternal grandmother, mother, paternal grandfather, paternal grandmother, and sister; Hyperlipidemia in her father, maternal grandfather, maternal grandmother, mother, paternal grandfather, paternal grandmother, and sister; Hypertension in her father, maternal grandfather, maternal grandmother, mother, paternal grandfather, paternal grandmother, and sister; Miscarriages / [place] in her mother. Problem List: Ms. Carrasco does not have any pertinent problems on file.  Lab Review  Kidney Function Lab Results  Component Value Date   BUN 26 (H) 03/05/2018   CREATININE 1.65 (H) 03/05/2018   GFRAA 37 (L) 03/05/2018   GFRNONAA 32 (L) 03/05/2018  Liver Function Lab Results  Component Value Date   AST 12 01/08/2018   ALT 13 01/08/2018   ALBUMIN 4.2 01/08/2018  Note: Above Lab results reviewed.  Imaging Review  ECHOCARDIOGRAM COMPLETE                         [facility]                        [address], [place] [address]                            [phone]  ------------------------------------------------------------------- Transthoracic Echocardiography  Patient:    Uliana, Brinker MR #:       [id number] Study Date: 01/26/2018 Gender:     F Age:        35 Height:     162.6 cm Weight:     77.2 kg BSA:         1.89 m^2 Pt. Status: Room:   ATTENDING    Candee Furbish, M.D.  SONOGRAPHER  Marygrace Drought, RCS  PERFORMING   [place], Outpatient  ORDERING     Skeet Latch, MD  [place], MD  cc:  ------------------------------------------------------------------- LV EF: 30% -   35%  ------------------------------------------------------------------- Indications:      MVR (D74.128).  ------------------------------------------------------------------- History:   PMH:  HLD.  Dyspnea.  Congestive heart failure.  Chronic obstructive pulmonary disease.  Risk factors:  Hypertension. Diabetes mellitus.  ------------------------------------------------------------------- Study Conclusions  - Left ventricle: The cavity size was normal. Wall thickness was   normal. Systolic function was moderately to severely reduced. The   estimated ejection fraction was in the range of 30% to 35%. Wall   motion was normal; there were no regional wall motion   abnormalities. Doppler parameters are consistent with abnormal   left ventricular relaxation (grade 1 diastolic dysfunction). - Ventricular septum: Septal motion showed abnormal function and   dyssynergy.  ------------------------------------------------------------------- Study data:  No prior study was available for comparison.  Study status:  Routine.  Procedure:  The patient reported no pain pre or post test. Transthoracic echocardiography. Image quality was adequate.          Transthoracic echocardiography.  M-mode, complete 2D, spectral Doppler, and color Doppler.  Birthdate: Patient birthdate: 1950-05-03.  Age:  Patient is 68 yr old.  Sex: Gender: female.    BMI: 29.2 kg/m^2.  Blood pressure:     111/67 Patient status:  Outpatient.  Study date:  Study date: 01/26/2018. Study time: 01:53 PM.  Location:  [facility]  -------------------------------------------------------------------  ------------------------------------------------------------------- Left ventricle:  The cavity size was normal. Wall thickness was normal. Systolic function was moderately to severely reduced. The estimated ejection fraction was in the range of 30% to 35%. Wall motion was normal; there were no regional wall motion abnormalities. Doppler parameters are consistent with abnormal left ventricular relaxation (grade 1 diastolic dysfunction).  ------------------------------------------------------------------- Aortic valve:   Trileaflet; normal thickness leaflets. Mobility was not restricted.  Doppler:  Transvalvular velocity  was within the normal range. There was no stenosis. There was no regurgitation.   ------------------------------------------------------------------- Aorta:  Aortic root: The aortic root was normal in size.  ------------------------------------------------------------------- Mitral valve:  Mitral valve repair, annuloplasty ring intact. Mobility was not restricted.  Doppler:  Transvalvular velocity was within the normal range. There was no evidence for stenosis. There was no regurgitation.    Valve area by pressure half-time: 4 cm^2. Indexed valve area by pressure half-time: 2.12 cm^2/m^2. Valve area by continuity equation (using LVOT flow): 0.84 cm^2. Indexed valve area by continuity equation (using LVOT flow): 0.44 cm^2/m^2. Mean gradient (D): 2 mm Hg. Peak gradient (D): 4 mm Hg.  ------------------------------------------------------------------- Left atrium:  The atrium was at the upper limits of normal in size.   ------------------------------------------------------------------- Right ventricle:  The cavity size was normal. Wall thickness was normal. Systolic function was normal.  ------------------------------------------------------------------- Ventricular septum:   Septal motion  showed abnormal function and dyssynergy.  ------------------------------------------------------------------- Pulmonic valve:   Poorly visualized.  Structurally normal valve. Cusp separation was normal.  Doppler:  Transvalvular velocity was within the normal range. There was no evidence for stenosis. There was no regurgitation.  ------------------------------------------------------------------- Tricuspid valve:   Structurally normal valve.    Doppler: Transvalvular velocity was within the normal range. There was no regurgitation.  ------------------------------------------------------------------- Pulmonary artery:   The main pulmonary artery was normal-sized. Systolic pressure was within the normal range.  ------------------------------------------------------------------- Right atrium:  The atrium was normal in size.  ------------------------------------------------------------------- Pericardium:  There was no pericardial effusion.  ------------------------------------------------------------------- Systemic veins: Inferior vena cava: The vessel was normal in size. The respirophasic diameter changes were in the normal range (= 50%), consistent with normal central venous pressure. Diameter: 12 mm.  ------------------------------------------------------------------- Measurements   IVC                                      Value          Reference  ID                                       12    mm       ----------    Left ventricle                           Value          Reference  LV ID, ED, PLAX chordal                  47.3  mm       43 - 52  LV ID, ES, PLAX chordal          (H)     41.8  mm       23 - 38  LV fx shortening, PLAX chordal   (L)     12    %        >=29  LV PW thickness, ED                      11.9  mm       ----------  IVS/LV PW ratio, ED                      0.77           <=  1.3  Stroke volume, 2D                        44    ml       ----------  Stroke  volume/bsa, 2D                    23    ml/m^2   ----------  LV e&', lateral                           8.16  cm/s     ----------  LV E/e&', lateral                         12.01          ----------  LV e&', medial                            4.35  cm/s     ----------  LV E/e&', medial                          22.53          ----------  LV e&', average                           6.26  cm/s     ----------  LV E/e&', average                         15.67          ----------    Ventricular septum                       Value          Reference  IVS thickness, ED                        9.13  mm       ----------    LVOT                                     Value          Reference  LVOT ID, S                               16    mm       ----------  LVOT area                                2.01  cm^2     ----------  LVOT peak velocity, S                    103   cm/s     ----------  LVOT mean velocity, S                    75.5  cm/s     ----------  LVOT VTI, S  21.7  cm       ----------    Aorta                                    Value          Reference  Aortic root ID, ED                       22    mm       ----------    Left atrium                              Value          Reference  LA ID, A-P, ES                           40    mm       ----------  LA ID/bsa, A-P                           2.12  cm/m^2   <=2.2  LA volume, S                             41.7  ml       ----------  LA volume/bsa, S                         22.1  ml/m^2   ----------  LA volume, ES, 1-p A4C                   32.7  ml       ----------  LA volume/bsa, ES, 1-p A4C               17.3  ml/m^2   ----------  LA volume, ES, 1-p A2C                   49.1  ml       ----------  LA volume/bsa, ES, 1-p A2C               26    ml/m^2   ----------    Mitral valve                             Value          Reference  Mitral E-wave peak velocity              98    cm/s     ----------  Mitral A-wave  peak velocity              120   cm/s     ----------  Mitral mean velocity, D                  63.5  cm/s     ----------  Mitral deceleration time         (H)     271   ms       150 - 230  Mitral pressure half-time  55    ms       ----------  Mitral mean gradient, D                  2     mm Hg    ----------  Mitral peak gradient, D                  4     mm Hg    ----------  Mitral E/A ratio, peak                   0.8            ----------  Mitral valve area, PHT, DP               4     cm^2     ----------  Mitral valve area/bsa, PHT, DP           2.12  cm^2/m^2 ----------  Mitral valve area, LVOT                  0.84  cm^2     ----------  continuity  Mitral valve area/bsa, LVOT              0.44  cm^2/m^2 ----------  continuity  Mitral annulus VTI, D                    19.1  cm       ----------    Pulmonary arteries                       Value          Reference  PA pressure, S, DP                       27    mm Hg    <=30    Tricuspid valve                          Value          Reference  Tricuspid regurg peak velocity           243   cm/s     ----------  Tricuspid peak RV-RA gradient            24    mm Hg    ----------  Tricuspid maximal regurg                 243   cm/s     ----------  velocity, PISA    Right atrium                             Value          Reference  RA ID, S-I, ES, A4C                      40.9  mm       34 - 49  RA area, ES, A4C                         10.1  cm^2     8.3 - 19.5  RA volume, ES, A/L  20.7  ml       ----------  RA volume/bsa, ES, A/L                   10.9  ml/m^2   ----------    Systemic veins                           Value          Reference  Estimated CVP                            3     mm Hg    ----------    Right ventricle                          Value          Reference  TAPSE                                    18.2  mm       ----------  RV pressure, S, DP                       27    mm Hg     <=30  RV s&', lateral, S                        9.14  cm/s     ----------  Legend: (L)  and  (H)  mark values outside specified reference range.  ------------------------------------------------------------------- Prepared and Electronically Authenticated by  Candee Furbish, M.D. 2020-01-07T15:50:59 Note: Reviewed        Physical Exam  General appearance: Well nourished, well developed, and well hydrated. In no apparent acute distress Mental status: Alert, oriented x 3 (person, place, & time)       Respiratory: No evidence of acute respiratory distress Eyes: PERLA Vitals: BP 112/68   Pulse 72   Temp 98.2 F (36.8 C)   Resp 16   Ht '5\' 4"'  (1.626 m)   Wt 160 lb (72.6 kg)   LMP  (LMP Unknown)   SpO2 99%   BMI 27.46 kg/m  BMI: Estimated body mass index is 27.46 kg/m as calculated from the following:   Height as of this encounter: '5\' 4"'  (1.626 m).   Weight as of this encounter: 160 lb (72.6 kg). Ideal: Ideal body weight: 54.7 kg (120 lb 9.5 oz) Adjusted ideal body weight: 61.9 kg (136 lb 5.7 oz) Cervical Spine Area Exam  Skin & Axial Inspection: Well healed scar from previous spine surgery detected Alignment: Symmetrical Functional ROM: Unrestricted ROM      Stability: No instability detected Muscle Tone/Strength: Functionally intact. No obvious neuro-muscular anomalies detected. Sensory (Neurological): Unimpaired Palpation: No palpable anomalies             Lumbar Spine Area Exam  Skin & Axial Inspection: No masses, redness, or swelling Alignment: Symmetrical Functional ROM: Unrestricted ROM       Stability: No instability detected Muscle Tone/Strength: Functionally intact. No obvious neuro-muscular anomalies detected. Sensory (Neurological): Unimpaired Palpation: Complains of area being tender to palpation       Provocative Tests: Hyperextension/rotation test: deferred today       Lumbar quadrant test (Kemp's test): deferred today  Lateral bending test: deferred  today       Patrick's Maneuver: deferred today                    Gait & Posture Assessment  Ambulation: Patient ambulates using a cane Gait: Antalgic gait (limping) Posture: Antalgic  Lower Extremity Exam    Side: Right lower extremity  Side: Left lower extremity  Stability: No instability observed          Stability: No instability observed          Skin & Extremity Inspection: Skin color, temperature, and hair growth are WNL. No peripheral edema or cyanosis. No masses, redness, swelling, asymmetry, or associated skin lesions. No contractures.  Skin & Extremity Inspection: Skin color, temperature, and hair growth are WNL. No peripheral edema or cyanosis. No masses, redness, swelling, asymmetry, or associated skin lesions. No contractures.  Functional ROM: Decreased ROM                  Functional ROM: Unrestricted ROM                  Muscle Tone/Strength: Guarding  Muscle Tone/Strength: Functionally intact. No obvious neuro-muscular anomalies detected.  Sensory (Neurological): Unimpaired        Sensory (Neurological): Unimpaired            Palpation: Complains of area being tender to palpation  Palpation: No palpable anomalies   Assessment   Status Diagnosis  Controlled Persistent Persistent 1. Degenerative disc disease, cervical   2. Lumbar degenerative disc disease   3. Fibromyalgia   4. Chronic pain syndrome   5. Long term prescription opiate use      Updated Problems: Problem  Lumbar Degenerative Disc Disease  Fibromyalgia  Degenerative Disc Disease, Cervical   2016 has surgery on her neck for some disc issues. Takes muscle relaxers.      Plan of Care  Pharmacotherapy (Medications Ordered): Meds ordered this encounter  Medications  . HYDROcodone-acetaminophen (NORCO) 10-325 MG tablet    Sig: Take 1 tablet by mouth every 8 (eight) hours as needed for up to 30 days for severe pain.    Dispense:  90 tablet    Refill:  0    Do not place this medication, or any other  prescription from our practice, on "Automatic Refill". Patient may have prescription filled one day early if pharmacy is closed on scheduled refill date. :    Order Specific Question:   Supervising Provider    Answer:   Gillis Santa [[id number]]  . pregabalin (LYRICA) 150 MG capsule    Sig: Take 1 capsule (150 mg total) by mouth 2 (two) times daily.    Dispense:  60 capsule    Refill:  1    Order Specific Question:   Supervising Provider    Answer:   Gillis Santa [[id number]]  . baclofen (LIORESAL) 10 MG tablet    Sig: Take 1 tablet (10 mg total) by mouth 2 (two) times daily.    Dispense:  60 tablet    Refill:  1    Order Specific Question:   Supervising Provider    Answer:   Gillis Santa [id number]  . Diclofenac Sodium (PENNSAID) 2 % SOLN    Sig: Place 0.5 g onto the skin 2 (two) times daily as needed.    Dispense:  1 Bottle    Refill:  1    Order Specific Question:   Supervising Provider    Answer:  [place], [place]  . HYDROcodone-acetaminophen (NORCO) 10-325 MG tablet    Sig: Take 1 tablet by mouth 3 (three) times daily as needed for up to 30 days for severe pain.    Dispense:  90 tablet    Refill:  0    Do not place this medication, or any other prescription from our practice, on "Automatic Refill". Patient may have prescription filled one day early if pharmacy is closed on scheduled refill date.    Order Specific Question:   Supervising Provider    Answer:   Gillis Santa [[id number]]   Administered today: Armandina Gemma had no medications administered during this visit.  Orders:  Orders Placed This Encounter  Procedures  . ToxASSURE Select 13 (MW), Urine    Volume: 30 ml(s). Minimum 3 ml of urine is needed. Document temperature of fresh sample. Indications: Long term (current) use of opiate analgesic (R67.893)   Follow-up plan:   Return in about 8 weeks (around 05/25/2018).  None at this time.   Note by: Dionisio David, NP Date: 03/30/2018; Time: 2:53 PM

## 2018-03-30 NOTE — Telephone Encounter (Signed)
Advised Dr. Curt Bears would like pt to first follow up w/ dentist post teeth extractions.  Pt reports that she f/u w/ dentist 2 weeks after procedure next week on 3/17. Pt and I agreed to speak beginning of April (first week/two) to see how things are going and discuss scheduling defibrillator. Pt agreeable to plan.

## 2018-03-30 NOTE — Patient Instructions (Addendum)
____________________________________________________________________________________________  Medication Rules  Purpose: To inform patients, and their family members, of our rules and regulations.  Applies to: All patients receiving prescriptions (written or electronic).  Pharmacy of record: Pharmacy where electronic prescriptions will be sent. If written prescriptions are taken to a different pharmacy, please inform the nursing staff. The pharmacy listed in the electronic medical record should be the one where you would like electronic prescriptions to be sent.  Electronic prescriptions: In compliance with the [place] Strengthen Opioid Misuse Prevention (STOP) Act of 2017 (Session Law 2017-74/H243), effective January 20, 2018, all controlled substances must be electronically prescribed. Calling prescriptions to the pharmacy will cease to exist.  Prescription refills: Only during scheduled appointments. Applies to all prescriptions.  NOTE: The following applies primarily to controlled substances (Opioid* Pain Medications).   Patient's responsibilities: 1. Pain Pills: Bring all pain pills to every appointment (except for procedure appointments). 2. Pill Bottles: Bring pills in original pharmacy bottle. Always bring the newest bottle. Bring bottle, even if empty. 3. Medication refills: You are responsible for knowing and keeping track of what medications you take and those you need refilled. The day before your appointment: write a list of all prescriptions that need to be refilled. The day of the appointment: give the list to the admitting nurse. Prescriptions will be written only during appointments. No prescriptions will be written on procedure days. If you forget a medication: it will not be "Called in", "Faxed", or "electronically sent". You will need to get another appointment to get these prescribed. No early refills. Do not call asking to have your prescription filled  early. 4. Prescription Accuracy: You are responsible for carefully inspecting your prescriptions before leaving our office. Have the discharge nurse carefully go over each prescription with you, before taking them home. Make sure that your name is accurately spelled, that your address is correct. Check the name and dose of your medication to make sure it is accurate. Check the number of pills, and the written instructions to make sure they are clear and accurate. Make sure that you are given enough medication to last until your next medication refill appointment. 5. Taking Medication: Take medication as prescribed. When it comes to controlled substances, taking less pills or less frequently than prescribed is permitted and encouraged. Never take more pills than instructed. Never take medication more frequently than prescribed.  6. Inform other Doctors: Always inform, all of your healthcare providers, of all the medications you take. 7. Pain Medication from other Providers: You are not allowed to accept any additional pain medication from any other Doctor or Healthcare provider. There are two exceptions to this rule. (see below) In the event that you require additional pain medication, you are responsible for notifying us, as stated below. 8. Medication Agreement: You are responsible for carefully reading and following our Medication Agreement. This must be signed before receiving any prescriptions from our practice. Safely store a copy of your signed Agreement. Violations to the Agreement will result in no further prescriptions. (Additional copies of our Medication Agreement are available upon request.) 9. Laws, Rules, & Regulations: All patients are expected to follow all Federal and State Laws, Statutes, Rules, & Regulations. Ignorance of the Laws does not constitute a valid excuse. The use of any illegal substances is prohibited. 10. Adopted CDC guidelines & recommendations: Target dosing levels will be  at or below 60 MME/day. Use of benzodiazepines** is not recommended.  Exceptions: There are only two exceptions to the rule of not   receiving pain medications from other Healthcare Providers. 1. Exception #1 (Emergencies): In the event of an emergency (i.e.: accident requiring emergency care), you are allowed to receive additional pain medication. However, you are responsible for: As soon as you are able, call our office [phone], at any time of the day or night, and leave a message stating your name, the date and nature of the emergency, and the name and dose of the medication prescribed. In the event that your call is answered by a member of our staff, make sure to document and save the date, time, and the name of the person that took your information.  2. Exception #2 (Planned Surgery): In the event that you are scheduled by another doctor or dentist to have any type of surgery or procedure, you are allowed (for a period no longer than 30 days), to receive additional pain medication, for the acute post-op pain. However, in this case, you are responsible for picking up a copy of our "Post-op Pain Management for Surgeons" handout, and giving it to your surgeon or dentist. This document is available at our office, and does not require an appointment to obtain it. Simply go to our office during business hours (Monday-Thursday from 8:00 AM to 4:00 PM) (Friday 8:00 AM to 12:00 Noon) or if you have a scheduled appointment with [place], prior to your surgery, and ask for it by name. In addition, you will need to provide [place] with your name, name of your surgeon, type of surgery, and date of procedure or surgery.  *Opioid medications include: morphine, codeine, oxycodone, oxymorphone, hydrocodone, hydromorphone, meperidine, tramadol, tapentadol, buprenorphine, fentanyl, methadone. **Benzodiazepine medications include: diazepam (Valium), alprazolam (Xanax), clonazepam (Klonopine), lorazepam (Ativan), clorazepate  (Tranxene), chlordiazepoxide (Librium), estazolam (Prosom), oxazepam (Serax), temazepam (Restoril), triazolam (Halcion) (Last updated: 03/19/2017) ____________________________________________________________________________________________ Two prescriptions for Hydrocodone, one for Baclofen, Pennsaid, and Lyrica were sent to your pharmacy.

## 2018-03-30 NOTE — Progress Notes (Signed)
Nursing Pain Medication Assessment:  Safety precautions to be maintained throughout the outpatient stay will include: orient to surroundings, keep bed in low position, maintain call bell within reach at all times, provide assistance with transfer out of bed and ambulation.  Medication Inspection Compliance: Pill count conducted under aseptic conditions, in front of the patient. Neither the pills nor the bottle was removed from the patient's sight at any time. Once count was completed pills were immediately returned to the patient in their original bottle.  Medication #1: Hydrocodone/APAP Pill/Patch Count: 24 of 90 pills remain Pill/Patch Appearance: Markings consistent with prescribed medication Bottle Appearance: Standard pharmacy container. Clearly labeled. Filled Date: 2 / 17 / 2020 Last Medication intake:  Yesterday  Medication #2: baclofen Pill/Patch Count: 0 of 60 pills remain Pill/Patch Appearance: Markings consistent with prescribed medication Bottle Appearance: Standard pharmacy container. Clearly labeled. Filled Date: 28 / 21 / 2019 Last Medication intake:  Ran out of medicine more than 48 hours ago

## 2018-04-01 NOTE — Telephone Encounter (Addendum)
Advised patient of Clindamycin instructions, verbalized understanding.

## 2018-04-05 ENCOUNTER — Inpatient Hospital Stay: Admission: RE | Admit: 2018-04-05 | Payer: Medicare HMO | Source: Ambulatory Visit

## 2018-04-05 LAB — TOXASSURE SELECT 13 (MW), URINE

## 2018-04-05 NOTE — Progress Notes (Signed)
Patient: Kara Williams MRN: [id number] DOB: July 16, 1950 PCP: Orma Flaming, MD     Subjective:  Chief Complaint  Patient presents with  . Follow-up    HPI: The patient is a 68 y.o. female who presents today for follow up of her diabetes and anxiety/depression.   Diabetes: Patient is here for follow up of type 2 diabetes.  Currently on the following medications: jardiance, metformin, lantus 10 units.   Takes medications as prescribed. Last A1C was 8.0. Not currently exercising and following diabetic diet. Sugars range from 109 to 160. Denies any hypoglycemic events. Denies any vision changes, nausea, vomiting, abdominal pain, ulcers/paraesthesia in feet, polyuria, polydipsia or polyphagia. Denies any chest pain, shortness of breath. Poor diet. Has limited income and does not eat like she should. Not exercising-waiting for ICD.   Anxiety and depression: we started her on prozac at last visit. She is currently on 40mg  of prozac. She has had no more nausea and her anxiety is much better controlled. She is sleeping better too.   Review of Systems  Constitutional: Negative for fatigue.  Respiratory: Negative for shortness of breath.   Cardiovascular: Negative for chest pain.  Gastrointestinal: Negative for abdominal pain and nausea.  Neurological: Negative for dizziness and headaches.  Psychiatric/Behavioral: Negative for sleep disturbance.    Allergies Patient is allergic to erythromycin; penicillins; sulfa antibiotics; lactose intolerance (gi); cortisone; and tetracyclines & related.  Past Medical History Patient  has a past medical history of Arthritis, Asthma, Cancer ([place]), Colon ulcer, Depression, Diabetes mellitus without complication ([place]), Emphysema of lung ([place]), Frequent headaches, Heart disease, Hyperlipidemia, Hypertension, Loss of teeth due to extraction, Osteoarthritis, Osteopenia, Panic attack, Psoriasis, PTSD (post-traumatic stress disorder), and Urine  incontinence.  Surgical History Patient  has a past surgical history that includes Tubal ligation (1975); Cholecystectomy; Neck surgery (2012); Leg Surgery (2016); Cardiac surgery (2019); and Eye surgery (2000).  Family History Pateint's family history includes Alcohol abuse in her father and mother; Arthritis in her father, mother, and sister; Asthma in her maternal grandfather, maternal grandmother, paternal grandfather, paternal grandmother, and sister; Bipolar disorder in her father, mother, and sister; COPD in her father, maternal grandfather, maternal grandmother, mother, paternal grandfather, paternal grandmother, and sister; Depression in her father, mother, and sister; Diabetes in her maternal grandfather, maternal grandmother, paternal grandfather, paternal grandmother, and sister; Drug abuse in her father and mother; Early death in her father and mother; Heart attack in her father, maternal grandfather, maternal grandmother, mother, paternal grandfather, paternal grandmother, and sister; Heart disease in her father, maternal grandfather, maternal grandmother, mother, paternal grandfather, paternal grandmother, and sister; Hyperlipidemia in her father, maternal grandfather, maternal grandmother, mother, paternal grandfather, paternal grandmother, and sister; Hypertension in her father, maternal grandfather, maternal grandmother, mother, paternal grandfather, paternal grandmother, and sister; Miscarriages / [place] in her mother.  Social History Patient  reports that she quit smoking about 40 years ago. She has never used smokeless tobacco. She reports that she does not drink alcohol or use drugs.    Objective: Vitals:   04/09/18 1445  BP: 120/72  Pulse: 68  Temp: (!) 97.4 F (36.3 C)  TempSrc: Oral  SpO2: 97%  Weight: 164 lb 3.2 oz (74.5 kg)  Height: 5\' 4"  (1.626 m)    Body mass index is 28.18 kg/m.  Physical Exam Vitals signs reviewed.  Constitutional:      Appearance:  Normal appearance.  Neck:     Musculoskeletal: Normal range of motion and neck supple.  Cardiovascular:  Rate and Rhythm: Normal rate and regular rhythm.     Heart sounds: Normal heart sounds.  Pulmonary:     Effort: Pulmonary effort is normal.     Breath sounds: Normal breath sounds.  Abdominal:     General: Abdomen is flat. Bowel sounds are normal.     Palpations: Abdomen is soft.  Neurological:     General: No focal deficit present.     Mental Status: She is alert and oriented to person, place, and time.  Psychiatric:        Mood and Affect: Mood normal.        Behavior: Behavior normal.     Comments: No si/hi/ah/vh           Office Visit from 04/09/2018 in [place]  PHQ-9 Total Score  0     GAD 7 : Generalized Anxiety Score 04/09/2018 02/19/2018  Nervous, Anxious, on Edge 0 2  Control/stop worrying 0 2  Worry too much - different things 0 2  Trouble relaxing 0 2  Restless 0 2  Easily annoyed or irritable 0 2  Afraid - awful might happen 1 0  Total GAD 7 Score 1 12  Anxiety Difficulty Not difficult at all Somewhat difficult     Assessment/plan: 1. Diabetes mellitus without complication (HCC) Fasting sugars are somewhat improved. Still not to goal. Will check labs and see where we are and adjust as needed. Need to get her to eye doctor. Needs pnuemonia shot, but still fearful of needles. F/u in 3-6 months.  - Hemoglobin A1c - Comprehensive metabolic panel  2. Anxiety with depression Extremely well controlled on prozac 40mg . GAD7 and phq9 scores are significantly/statistically improved. Refills given. Continue this medication. Let me know if any issues.      Return in about 3 months (around 07/10/2018).    Orma Flaming, MD [place]   04/09/2018

## 2018-04-06 DIAGNOSIS — R6889 Other general symptoms and signs: Secondary | ICD-10-CM | POA: Diagnosis not present

## 2018-04-07 ENCOUNTER — Telehealth: Payer: Self-pay | Admitting: Family Medicine

## 2018-04-07 NOTE — Telephone Encounter (Signed)
Called pt to inform her that we are not scheduling elective procedures at this time d/t COVID-19. Pt reports that she had her post teeth removal appt w/ dentist.  She is doing good, healing properly. Pt aware I will call her to reschedule ICD implant when safe to do so. Advised to call the office if she has any issues r/t her heart in the meantime. Pt agreeable to plan.

## 2018-04-07 NOTE — Telephone Encounter (Signed)
Called pt and left VM to call the office. CRM created.

## 2018-04-07 NOTE — Telephone Encounter (Signed)
Pt given lab results per notes of Dr. Rogers Blocker on 04/07/18. Pt verbalized understanding. She says to let Dr. Rogers Blocker know she had all 5 teeth pulled and doing well.

## 2018-04-07 NOTE — Telephone Encounter (Signed)
See note

## 2018-04-07 NOTE — Telephone Encounter (Signed)
Please call her and let her know her cologaurd is negative. This was her screening for colon cancer.  She can repeat this in 3 years. Thank you so much for getting this done.  aw

## 2018-04-08 NOTE — Telephone Encounter (Signed)
FYI

## 2018-04-09 ENCOUNTER — Encounter: Payer: Self-pay | Admitting: Family Medicine

## 2018-04-09 ENCOUNTER — Other Ambulatory Visit: Payer: Self-pay

## 2018-04-09 ENCOUNTER — Ambulatory Visit (INDEPENDENT_AMBULATORY_CARE_PROVIDER_SITE_OTHER): Payer: Medicare HMO | Admitting: Family Medicine

## 2018-04-09 VITALS — BP 120/72 | HR 68 | Temp 97.4°F | Ht 64.0 in | Wt 164.2 lb

## 2018-04-09 DIAGNOSIS — E119 Type 2 diabetes mellitus without complications: Secondary | ICD-10-CM

## 2018-04-09 DIAGNOSIS — F418 Other specified anxiety disorders: Secondary | ICD-10-CM | POA: Diagnosis not present

## 2018-04-09 DIAGNOSIS — R6889 Other general symptoms and signs: Secondary | ICD-10-CM | POA: Diagnosis not present

## 2018-04-09 LAB — COMPREHENSIVE METABOLIC PANEL
ALBUMIN: 4.3 g/dL (ref 3.5–5.2)
ALT: 12 U/L (ref 0–35)
AST: 13 U/L (ref 0–37)
Alkaline Phosphatase: 63 U/L (ref 39–117)
BUN: 28 mg/dL — ABNORMAL HIGH (ref 6–23)
CO2: 23 mEq/L (ref 19–32)
Calcium: 9.6 mg/dL (ref 8.4–10.5)
Chloride: 103 mEq/L (ref 96–112)
Creatinine, Ser: 1.22 mg/dL — ABNORMAL HIGH (ref 0.40–1.20)
GFR: 43.91 mL/min — ABNORMAL LOW (ref >60.00)
Glucose, Bld: 148 mg/dL — ABNORMAL HIGH (ref 70–99)
Potassium: 5.1 mEq/L (ref 3.5–5.1)
Sodium: 136 mEq/L (ref 135–145)
Total Bilirubin: 0.6 mg/dL (ref 0.2–1.2)
Total Protein: 6.5 g/dL (ref 6.0–8.3)

## 2018-04-09 LAB — HEMOGLOBIN A1C: Hgb A1c MFr Bld: 7.7 % — ABNORMAL HIGH (ref 4.6–6.5)

## 2018-04-09 MED ORDER — ALBUTEROL SULFATE (2.5 MG/3ML) 0.083% IN NEBU: 2.5000 mg | INHALATION_SOLUTION | Freq: Four times a day (QID) | RESPIRATORY_TRACT | 1 refills | Status: AC | PRN

## 2018-04-09 MED ORDER — FLUOXETINE HCL 40 MG PO CAPS: 40.0000 mg | ORAL_CAPSULE | Freq: Every day | ORAL | 3 refills | Status: AC

## 2018-04-12 ENCOUNTER — Other Ambulatory Visit: Payer: Self-pay | Admitting: Family Medicine

## 2018-04-12 DIAGNOSIS — E119 Type 2 diabetes mellitus without complications: Secondary | ICD-10-CM

## 2018-04-19 ENCOUNTER — Other Ambulatory Visit: Payer: Self-pay | Admitting: Family Medicine

## 2018-04-19 ENCOUNTER — Ambulatory Visit: Payer: Medicare HMO | Admitting: Psychology

## 2018-04-21 NOTE — Telephone Encounter (Signed)
Last OV 04/09/18 Last refill 03/11/18 #60/1 Next OV not scheduled

## 2018-04-23 ENCOUNTER — Encounter: Payer: Self-pay | Admitting: Family Medicine

## 2018-04-23 DIAGNOSIS — I340 Nonrheumatic mitral (valve) insufficiency: Secondary | ICD-10-CM | POA: Insufficient documentation

## 2018-04-23 DIAGNOSIS — Q211 Atrial septal defect, unspecified: Secondary | ICD-10-CM | POA: Insufficient documentation

## 2018-04-23 LAB — EKG

## 2018-04-23 LAB — CT SHOULDER RIGHT W WO CONTRAST

## 2018-04-23 LAB — MR SHOULDER RIGHT WO CONTRAST

## 2018-04-23 LAB — CHEST X-RAY 2 VIEWS (71020)

## 2018-05-03 ENCOUNTER — Ambulatory Visit (INDEPENDENT_AMBULATORY_CARE_PROVIDER_SITE_OTHER): Payer: Medicare HMO | Admitting: Psychology

## 2018-05-03 DIAGNOSIS — F411 Generalized anxiety disorder: Secondary | ICD-10-CM | POA: Diagnosis not present

## 2018-05-06 ENCOUNTER — Other Ambulatory Visit: Payer: Medicare HMO

## 2018-05-06 ENCOUNTER — Ambulatory Visit: Payer: Medicare HMO

## 2018-05-07 ENCOUNTER — Other Ambulatory Visit: Payer: Self-pay | Admitting: Family Medicine

## 2018-05-07 MED ORDER — ATORVASTATIN CALCIUM 80 MG PO TABS: 80.0000 mg | ORAL_TABLET | Freq: Every day | ORAL | 2 refills | Status: AC

## 2018-05-07 NOTE — Telephone Encounter (Signed)
Requested Prescriptions  Pending Prescriptions Disp Refills  . atorvastatin (LIPITOR) 80 MG tablet 90 tablet 2    Sig: Take 1 tablet (80 mg total) by mouth at bedtime.     Cardiovascular:  Antilipid - Statins Passed - 05/07/2018  1:52 PM      Passed - Total Cholesterol in normal range and within 360 days    Cholesterol  Date Value Ref Range Status  01/08/2018 120 0 - 200 mg/dL Final    Comment:    ATP III Classification       Desirable:  < 200 mg/dL               Borderline High:  200 - 239 mg/dL          High:  > = 240 mg/dL         Passed - LDL in normal range and within 360 days    LDL Cholesterol  Date Value Ref Range Status  01/08/2018 50 0 - 99 mg/dL Final         Passed - HDL in normal range and within 360 days    HDL  Date Value Ref Range Status  01/08/2018 50.20 >39.00 mg/dL Final         Passed - Triglycerides in normal range and within 360 days    Triglycerides  Date Value Ref Range Status  01/08/2018 98.0 0.0 - 149.0 mg/dL Final    Comment:    Normal:  <150 mg/dLBorderline High:  150 - 199 mg/dL         Passed - Patient is not pregnant      Passed - Valid encounter within last 12 months    Recent Outpatient Visits          4 weeks ago Diabetes mellitus without complication ([place])   [facility] Jacqualine Code, MD   1 month ago Anxiety with depression   [place] Wolfe, Ebony Hail, MD   2 months ago Depression, recurrent [facility])   [place] Wolfe, Ebony Hail, MD   3 months ago Diabetes mellitus without complication [facility])   [place] Wolfe, Ebony Hail, MD   4 months ago Intractable vomiting with nausea, unspecified vomiting type   [facility] Jacqualine Code, MD

## 2018-05-17 ENCOUNTER — Ambulatory Visit (INDEPENDENT_AMBULATORY_CARE_PROVIDER_SITE_OTHER): Payer: Medicare HMO | Admitting: Psychology

## 2018-05-17 DIAGNOSIS — F411 Generalized anxiety disorder: Secondary | ICD-10-CM | POA: Diagnosis not present

## 2018-05-20 ENCOUNTER — Other Ambulatory Visit: Payer: Self-pay | Admitting: Family Medicine

## 2018-05-24 ENCOUNTER — Other Ambulatory Visit: Payer: Self-pay | Admitting: Family Medicine

## 2018-05-24 DIAGNOSIS — R059 Cough, unspecified: Secondary | ICD-10-CM

## 2018-05-24 DIAGNOSIS — R05 Cough: Secondary | ICD-10-CM

## 2018-05-25 ENCOUNTER — Other Ambulatory Visit: Payer: Self-pay

## 2018-05-25 ENCOUNTER — Ambulatory Visit: Payer: Medicare HMO | Attending: Nurse Practitioner | Admitting: Nurse Practitioner

## 2018-05-25 DIAGNOSIS — M47817 Spondylosis without myelopathy or radiculopathy, lumbosacral region: Secondary | ICD-10-CM

## 2018-05-25 DIAGNOSIS — M5030 Other cervical disc degeneration, unspecified cervical region: Secondary | ICD-10-CM

## 2018-05-25 DIAGNOSIS — G894 Chronic pain syndrome: Secondary | ICD-10-CM | POA: Diagnosis not present

## 2018-05-25 DIAGNOSIS — M5136 Other intervertebral disc degeneration, lumbar region: Secondary | ICD-10-CM

## 2018-05-25 DIAGNOSIS — M51369 Other intervertebral disc degeneration, lumbar region without mention of lumbar back pain or lower extremity pain: Secondary | ICD-10-CM

## 2018-05-25 DIAGNOSIS — M549 Dorsalgia, unspecified: Secondary | ICD-10-CM

## 2018-05-25 MED ORDER — HYDROCODONE-ACETAMINOPHEN 10-325 MG PO TABS: 1.0000 | ORAL_TABLET | Freq: Three times a day (TID) | ORAL | 0 refills | Status: AC | PRN

## 2018-05-25 MED ORDER — DICLOFENAC SODIUM 2 % TD SOLN: 0.5000 g | Freq: Two times a day (BID) | TRANSDERMAL | 1 refills | Status: AC | PRN

## 2018-05-25 NOTE — Patient Instructions (Signed)
____________________________________________________________________________________________  Medication Rules  Purpose: To inform patients, and their family members, of our rules and regulations.  Applies to: All patients receiving prescriptions (written or electronic).  Pharmacy of record: Pharmacy where electronic prescriptions will be sent. If written prescriptions are taken to a different pharmacy, please inform the nursing staff. The pharmacy listed in the electronic medical record should be the one where you would like electronic prescriptions to be sent.  Electronic prescriptions: In compliance with the [place] Strengthen Opioid Misuse Prevention (STOP) Act of 2017 (Session Law 2017-74/H243), effective January 20, 2018, all controlled substances must be electronically prescribed. Calling prescriptions to the pharmacy will cease to exist.  Prescription refills: Only during scheduled appointments. Applies to all prescriptions.  NOTE: The following applies primarily to controlled substances (Opioid* Pain Medications).   Patient's responsibilities: 1. Pain Pills: Bring all pain pills to every appointment (except for procedure appointments). 2. Pill Bottles: Bring pills in original pharmacy bottle. Always bring the newest bottle. Bring bottle, even if empty. 3. Medication refills: You are responsible for knowing and keeping track of what medications you take and those you need refilled. The day before your appointment: write a list of all prescriptions that need to be refilled. The day of the appointment: give the list to the admitting nurse. Prescriptions will be written only during appointments. No prescriptions will be written on procedure days. If you forget a medication: it will not be "Called in", "Faxed", or "electronically sent". You will need to get another appointment to get these prescribed. No early refills. Do not call asking to have your prescription filled  early. 4. Prescription Accuracy: You are responsible for carefully inspecting your prescriptions before leaving our office. Have the discharge nurse carefully go over each prescription with you, before taking them home. Make sure that your name is accurately spelled, that your address is correct. Check the name and dose of your medication to make sure it is accurate. Check the number of pills, and the written instructions to make sure they are clear and accurate. Make sure that you are given enough medication to last until your next medication refill appointment. 5. Taking Medication: Take medication as prescribed. When it comes to controlled substances, taking less pills or less frequently than prescribed is permitted and encouraged. Never take more pills than instructed. Never take medication more frequently than prescribed.  6. Inform other Doctors: Always inform, all of your healthcare providers, of all the medications you take. 7. Pain Medication from other Providers: You are not allowed to accept any additional pain medication from any other Doctor or Healthcare provider. There are two exceptions to this rule. (see below) In the event that you require additional pain medication, you are responsible for notifying us, as stated below. 8. Medication Agreement: You are responsible for carefully reading and following our Medication Agreement. This must be signed before receiving any prescriptions from our practice. Safely store a copy of your signed Agreement. Violations to the Agreement will result in no further prescriptions. (Additional copies of our Medication Agreement are available upon request.) 9. Laws, Rules, & Regulations: All patients are expected to follow all Federal and State Laws, Statutes, Rules, & Regulations. Ignorance of the Laws does not constitute a valid excuse. The use of any illegal substances is prohibited. 10. Adopted CDC guidelines & recommendations: Target dosing levels will be  at or below 60 MME/day. Use of benzodiazepines** is not recommended.  Exceptions: There are only two exceptions to the rule of not   receiving pain medications from other Healthcare Providers. 1. Exception #1 (Emergencies): In the event of an emergency (i.e.: accident requiring emergency care), you are allowed to receive additional pain medication. However, you are responsible for: As soon as you are able, call our office [phone], at any time of the day or night, and leave a message stating your name, the date and nature of the emergency, and the name and dose of the medication prescribed. In the event that your call is answered by a member of our staff, make sure to document and save the date, time, and the name of the person that took your information.  2. Exception #2 (Planned Surgery): In the event that you are scheduled by another doctor or dentist to have any type of surgery or procedure, you are allowed (for a period no longer than 30 days), to receive additional pain medication, for the acute post-op pain. However, in this case, you are responsible for picking up a copy of our "Post-op Pain Management for Surgeons" handout, and giving it to your surgeon or dentist. This document is available at our office, and does not require an appointment to obtain it. Simply go to our office during business hours (Monday-Thursday from 8:00 AM to 4:00 PM) (Friday 8:00 AM to 12:00 Noon) or if you have a scheduled appointment with us, prior to your surgery, and ask for it by name. In addition, you will need to provide us with your name, name of your surgeon, type of surgery, and date of procedure or surgery.  *Opioid medications include: morphine, codeine, oxycodone, oxymorphone, hydrocodone, hydromorphone, meperidine, tramadol, tapentadol, buprenorphine, fentanyl, methadone. **Benzodiazepine medications include: diazepam (Valium), alprazolam (Xanax), clonazepam (Klonopine), lorazepam (Ativan), clorazepate  (Tranxene), chlordiazepoxide (Librium), estazolam (Prosom), oxazepam (Serax), temazepam (Restoril), triazolam (Halcion) (Last updated: 03/19/2017) ____________________________________________________________________________________________    

## 2018-05-25 NOTE — Progress Notes (Signed)
Pain Management Encounter Note - Virtual Williams via Telephone Telehealth (real-time audio visits between healthcare provider and patient).  Patient's Phone No. & Preferred Pharmacy:  [phone] (home); [phone] (mobile); (Preferred) [phone]  [id number] [place] [place]: [phone]: [phone]  Pre-screening note:  Our staff contacted Kara Williams offered her an "in person", "face-to-face" appointment versus a telephone encounter. She indicated preferring the telephone encounter, at this time.  Reason for Virtual Williams: COVID-19*  Social distancing based on CDC and AMA recommendations.   I contacted Kara Williams 05/25/2018 at 9:01 AM by telephone and clearly identified myself as CDionisio David NP. I verified that I was speaking with the correct person using two identifiers (Name and date of birth: [birth year].  Advanced Informed Consent I sought verbal advanced consent from Kara Williams. I informed Kara Williams of the security and privacy concerns, risks, and limitations associated with performing an evaluation and management service by telephone. I also informed Kara Williams of the availability of "in person" appointments and I informed her of the possibility of a patient responsible charge related to this service. [name] understanding and agreed to proceed.   Historic Elements   [name] a 68y.o. year old, female patient evaluated today after her last encounter by our practice on 03/30/2018. Kara Williams has a past medical history of Arthritis, Asthma, Cancer ([place], Colon ulcer, Depression, Diabetes mellitus without complication ([place], Emphysema of lung ([place], Frequent headaches, Heart disease, Hyperlipidemia, Hypertension, Loss of teeth due to extraction, Osteoarthritis, Osteopenia, Panic  attack, Psoriasis, PTSD (post-traumatic stress disorder), and Urine incontinence. She also  has a past surgical history that includes Tubal ligation (1975); Cholecystectomy; Neck surgery (2012); Leg Surgery (2016); Cardiac surgery (2019); and Eye surgery (2000). [name] a current medication list which includes the following prescription(s): accu-chek fastclix lancets, albuterol, alprazolam, vitamin c, aspirin ec, atorvastatin, baclofen, blood glucose meter kit and supplies, calcium carb-cholecalciferol, carvedilol, cholecalciferol, diclofenac sodium, empagliflozin, entresto, fluoxetine, fluticasone, glucose blood, hydrocodone-acetaminophen, hydrocodone-acetaminophen, hydroxyzine, insulin pen needle, klor-con m20, lantus solostar, metformin, montelukast, montelukast, myrbetriq, ondansetron, pantoprazole, pazeo, pregabalin, symbicort, and ventolin hfa. She  reports that she quit smoking about 40 years ago. She has never used smokeless tobacco. She reports that she does not drink alcohol or use drugs. [name] allergic to erythromycin; penicillins; sulfa antibiotics; lactose intolerance (gi); macrolides and ketolides; cortisone; and tetracyclines & related.   HPI  I last saw her on 03/30/2018. She is being evaluated for medication management. She has 6/10 lower back pain. She has bilateral leg pain. She has numbness and tingling with weakness. She has frequent falls. She last fell in Jan. She feels like her pain has been the same for the last 26 years. She denies any new concerns today.   Pharmacotherapy Assessment  Analgesic:Hydrocodone 10 mg 3 times daily as needed MME: 30 mg / day Monitoring: Pharmacotherapy: No side-effects or adverse reactions reported. [place] PMP: PDMP reviewed during this encounter.       Compliance: No problems identified. Plan: Refer to "POC".  Review of recent tests  ECHOCARDIOGRAM COMPLETE                         *[facility]*  [address]  [place], [place] [address]                            [phone]  ------------------------------------------------------------------- Transthoracic Echocardiography  Patient:    [name], [name] MR #:       [id number] Study Date: 01/26/2018 Gender:     F Age:        68 Height:     162.6 cm Weight:     77.2 kg BSA:        1.89 m^2 Pt. Status: Room:   ATTENDING    Candee Furbish, M.D.  SONOGRAPHER  Marygrace Drought, RCS  PERFORMING   [place], Outpatient  ORDERING     Skeet Latch, MD  [place], MD  cc:  ------------------------------------------------------------------- LV EF: 30% -   35%  ------------------------------------------------------------------- Indications:      MVR (W10.272).  ------------------------------------------------------------------- History:   PMH:  HLD.  Dyspnea.  Congestive heart failure.  Chronic obstructive pulmonary disease.  Risk factors:  Hypertension. Diabetes mellitus.  ------------------------------------------------------------------- Study Conclusions  - Left ventricle: The cavity size was normal. Wall thickness was   normal. Systolic function was moderately to severely reduced. The   estimated ejection fraction was in the range of 30% to 35%. Wall   motion was normal; there were no regional wall motion   abnormalities. Doppler parameters are consistent with abnormal   left ventricular relaxation (grade 1 diastolic dysfunction). - Ventricular septum: Septal motion showed abnormal function and   dyssynergy.  ------------------------------------------------------------------- Study data:  No prior study was available for comparison.  Study status:  Routine.  Procedure:  The patient reported no pain pre or post test. Transthoracic echocardiography. Image quality was adequate.          Transthoracic echocardiography.  M-mode, complete 2D, spectral Doppler, and color Doppler.   Birthdate: Patient birthdate: [birth year].  Age:  Patient is 68 yr old.  Sex: Gender: female.    BMI: 29.2 kg/m^2.  Blood pressure:     111/67 Patient status:  Outpatient.  Study date:  Study date: 01/26/2018. Study time: 01:53 PM.  Location:  [facility]  -------------------------------------------------------------------  ------------------------------------------------------------------- Left ventricle:  The cavity size was normal. Wall thickness was normal. Systolic function was moderately to severely reduced. The estimated ejection fraction was in the range of 30% to 35%. Wall motion was normal; there were no regional wall motion abnormalities. Doppler parameters are consistent with abnormal left ventricular relaxation (grade 1 diastolic dysfunction).  ------------------------------------------------------------------- Aortic valve:   Trileaflet; normal thickness leaflets. Mobility was not restricted.  Doppler:  Transvalvular velocity was within the normal range. There was no stenosis. There was no regurgitation.   ------------------------------------------------------------------- Aorta:  Aortic root: The aortic root was normal in size.  ------------------------------------------------------------------- Mitral valve:  Mitral valve repair, annuloplasty ring intact. Mobility was not restricted.  Doppler:  Transvalvular velocity was within the normal range. There was no evidence for stenosis. There was no regurgitation.    Valve area by pressure half-time: 4 cm^2. Indexed valve area by pressure half-time: 2.12 cm^2/m^2. Valve area by continuity equation (using LVOT flow): 0.84 cm^2. Indexed valve area by continuity equation (using LVOT flow): 0.44 cm^2/m^2. Mean gradient (D): 2 mm Hg. Peak gradient (D): 4 mm Hg.  ------------------------------------------------------------------- Left atrium:  The atrium was at  the upper limits of normal in size.    ------------------------------------------------------------------- Right ventricle:  The cavity size was normal. Wall thickness was normal. Systolic function was normal.  ------------------------------------------------------------------- Ventricular septum:   Septal motion showed abnormal function and dyssynergy.  ------------------------------------------------------------------- Pulmonic valve:   Poorly visualized.  Structurally normal valve. Cusp separation was normal.  Doppler:  Transvalvular velocity was within the normal range. There was no evidence for stenosis. There was no regurgitation.  ------------------------------------------------------------------- Tricuspid valve:   Structurally normal valve.    Doppler: Transvalvular velocity was within the normal range. There was no regurgitation.  ------------------------------------------------------------------- Pulmonary artery:   The main pulmonary artery was normal-sized. Systolic pressure was within the normal range.  ------------------------------------------------------------------- Right atrium:  The atrium was normal in size.  ------------------------------------------------------------------- Pericardium:  There was no pericardial effusion.  ------------------------------------------------------------------- Systemic veins: Inferior vena cava: The vessel was normal in size. The respirophasic diameter changes were in the normal range (= 50%), consistent with normal central venous pressure. Diameter: 12 mm.  ------------------------------------------------------------------- Measurements   IVC                                      Value          Reference  ID                                       12    mm       ----------    Left ventricle                           Value          Reference  LV ID, ED, PLAX chordal                  47.3  mm       43 - 52  LV ID, ES, PLAX chordal          (H)     41.8  mm        23 - 38  LV fx shortening, PLAX chordal   (L)     12    %        >=29  LV PW thickness, ED                      11.9  mm       ----------  IVS/LV PW ratio, ED                      0.77           <=1.3  Stroke volume, 2D                        44    ml       ----------  Stroke volume/bsa, 2D                    23    ml/m^2   ----------  LV e&', lateral                           8.16  cm/s     ----------  LV E/e&', lateral  12.01          ----------  LV e&', medial                            4.35  cm/s     ----------  LV E/e&', medial                          22.53          ----------  LV e&', average                           6.26  cm/s     ----------  LV E/e&', average                         15.67          ----------    Ventricular septum                       Value          Reference  IVS thickness, ED                        9.13  mm       ----------    LVOT                                     Value          Reference  LVOT ID, S                               16    mm       ----------  LVOT area                                2.01  cm^2     ----------  LVOT peak velocity, S                    103   cm/s     ----------  LVOT mean velocity, S                    75.5  cm/s     ----------  LVOT VTI, S                              21.7  cm       ----------    Aorta                                    Value          Reference  Aortic root ID, ED                       22    mm       ----------    Left atrium  Value          Reference  LA ID, A-P, ES                           40    mm       ----------  LA ID/bsa, A-P                           2.12  cm/m^2   <=2.2  LA volume, S                             41.7  ml       ----------  LA volume/bsa, S                         22.1  ml/m^2   ----------  LA volume, ES, 1-p A4C                   32.7  ml       ----------  LA volume/bsa, ES, 1-p A4C               17.3  ml/m^2   ----------  LA  volume, ES, 1-p A2C                   49.1  ml       ----------  LA volume/bsa, ES, 1-p A2C               26    ml/m^2   ----------    Mitral valve                             Value          Reference  Mitral E-wave peak velocity              98    cm/s     ----------  Mitral A-wave peak velocity              120   cm/s     ----------  Mitral mean velocity, D                  63.5  cm/s     ----------  Mitral deceleration time         (H)     271   ms       150 - 230  Mitral pressure half-time                55    ms       ----------  Mitral mean gradient, D                  2     mm Hg    ----------  Mitral peak gradient, D                  4     mm Hg    ----------  Mitral E/A ratio, peak                   0.8            ----------  Mitral valve area, PHT, DP  4     cm^2     ----------  Mitral valve area/bsa, PHT, DP           2.12  cm^2/m^2 ----------  Mitral valve area, LVOT                  0.84  cm^2     ----------  continuity  Mitral valve area/bsa, LVOT              0.44  cm^2/m^2 ----------  continuity  Mitral annulus VTI, D                    19.1  cm       ----------    Pulmonary arteries                       Value          Reference  PA pressure, S, DP                       27    mm Hg    <=30    Tricuspid valve                          Value          Reference  Tricuspid regurg peak velocity           243   cm/s     ----------  Tricuspid peak RV-RA gradient            24    mm Hg    ----------  Tricuspid maximal regurg                 243   cm/s     ----------  velocity, PISA    Right atrium                             Value          Reference  RA ID, S-I, ES, A4C                      40.9  mm       34 - 49  RA area, ES, A4C                         10.1  cm^2     8.3 - 19.5  RA volume, ES, A/L                       20.7  ml       ----------  RA volume/bsa, ES, A/L                   10.9  ml/m^2   ----------    Systemic veins                            Value          Reference  Estimated CVP                            3     mm Hg    ----------    Right ventricle  Value          Reference  TAPSE                                    18.2  mm       ----------  RV pressure, S, DP                       27    mm Hg    <=30  RV s&', lateral, S                        9.14  cm/s     ----------  Legend: (L)  and  (H)  mark values outside specified reference range.  ------------------------------------------------------------------- Prepared and Electronically Authenticated by  Candee Furbish, M.D. 2020-01-07T15:50:59   Abstract on 04/23/2018  Component Date Value Ref Range Status  . Hemoglobin 02/18/2017 11.3* 12.0 - 16.0 Final  . HCT 02/18/2017 36  36 - 46 Final  . Platelets 02/18/2017 487* 150 - 399 Final  . WBC 02/18/2017 6.7   Final  . Glucose 03/18/2017 158   Final  . Creatinine 03/18/2017 1.0  0.5 - 1.1 Final  . Potassium 03/18/2017 5.0  3.4 - 5.3 Final  . Sodium 03/18/2017 140  137 - 147 Final  . Alkaline Phosphatase 03/18/2017 108  25 - 125 Final  . ALT 03/18/2017 19  7 - 35 Final  . AST 03/18/2017 20  13 - 35 Final  .                              . 09/12/2015 LBBB   Final  . HM Mammogram 07/16/2017 0-4 Bi-Rad  0-4 Bi-Rad, Self Reported Normal Final  .                              . 04/30/2017    Final   Partial tear of the supraspinatus tendon occurring at the insertion.  Bicipital tenosynovitis.  Joint Effusion  .                              . 04/06/2017 see comment   Final   Moderate joint effusion in the glenohumeral joint.  Internal derangement is not excluded.  Consider MRI  .                              Marland Kitchen 02/26/2017 WNL   Final   CLEAR.  Post sternotomy changes.  Fluid in the costophrenic angles   Assessment  The primary encounter diagnosis was Degenerative disc disease, cervical. Diagnoses of Lumbar degenerative disc disease, Chronic pain syndrome, Osteoarthritis of lumbosacral spine, and  Musculoskeletal back pain were also pertinent to this Williams.  Plan of Care  I am having Kara Williams maintain her Lantus SoloStar, aspirin EC, Cholecalciferol (VITAMIN D3 PO), Insulin Pen Needle, carvedilol, Symbicort, fluticasone, ALPRAZolam, vitamin C, ondansetron, blood glucose meter kit and supplies, Entresto, Calcium Carb-Cholecalciferol, montelukast, metFORMIN, glucose blood, Klor-Con M20, [place], Accu-Chek FastClix Lancets, Myrbetriq, empagliflozin, montelukast, pregabalin, baclofen, albuterol, FLUoxetine, hydrOXYzine, atorvastatin, pantoprazole, Ventolin HFA, HYDROcodone-acetaminophen, HYDROcodone-acetaminophen, and Diclofenac Sodium.  Pharmacotherapy (Medications Ordered): Meds ordered this encounter  Medications  .  HYDROcodone-acetaminophen (NORCO) 10-325 MG tablet    Sig: Take 1 tablet by mouth 3 (three) times daily as needed for up to 30 days for severe pain.    Dispense:  90 tablet    Refill:  0    Do not place this medication, or any other prescription from our practice, on "Automatic Refill". Patient may have prescription filled one day early if pharmacy is closed on scheduled refill date.    Order Specific Question:   Supervising Provider    Answer:   Gillis Santa [[id number]]  . HYDROcodone-acetaminophen (NORCO) 10-325 MG tablet    Sig: Take 1 tablet by mouth every 8 (eight) hours as needed for up to 30 days for severe pain.    Dispense:  90 tablet    Refill:  0    Do not place this medication, or any other prescription from our practice, on "Automatic Refill". Patient may have prescription filled one day early if pharmacy is closed on scheduled refill date. :    Order Specific Question:   Supervising Provider    Answer:   Gillis Santa [[id number]]  . Diclofenac Sodium (PENNSAID) 2 % SOLN    Sig: Place 0.5 g onto the skin 2 (two) times daily as needed.    Dispense:  1 Bottle    Refill:  1    Order Specific Question:   Supervising Provider    Answer:   Gillis Santa [[id number]]    Orders:  No orders of the defined types were placed in this encounter.  Follow-up plan:   Return in about 2 months (around 07/25/2018) for MedMgmt.   I discussed the assessment and treatment plan with the patient. The patient was provided an opportunity to ask questions and all were answered. The patient agreed with the plan and demonstrated an understanding of the instructions.  Patient advised to call back or seek an in-person evaluation if the symptoms or condition worsens.  Total duration of non-face-to-face encounter: 11 minutes.  Note by: Dionisio David, NP Date: 05/25/2018; Time: 9:11 AM  Disclaimer:  * Given the special circumstances of the COVID-19 pandemic, the federal government has announced that the Office for Civil Rights (OCR) will exercise its enforcement discretion and will not impose penalties on physicians using telehealth in the event of noncompliance with regulatory requirements under the [place] and [place] (HIPAA) in connection with the good faith provision of telehealth during the YOMAY-04 national public health emergency. ([place])

## 2018-05-31 ENCOUNTER — Telehealth (HOSPITAL_COMMUNITY): Payer: Self-pay

## 2018-05-31 NOTE — Telephone Encounter (Signed)
Left message to call back to schedule echo.

## 2018-06-06 ENCOUNTER — Other Ambulatory Visit: Payer: Self-pay | Admitting: Family Medicine

## 2018-06-07 ENCOUNTER — Ambulatory Visit: Payer: Medicare HMO | Admitting: Psychology

## 2018-06-08 ENCOUNTER — Telehealth (HOSPITAL_COMMUNITY): Payer: Self-pay | Admitting: *Deleted

## 2018-06-08 NOTE — Telephone Encounter (Signed)
Left message to call office about appointment.(COVID 19)

## 2018-06-09 ENCOUNTER — Other Ambulatory Visit: Payer: Self-pay | Admitting: Family Medicine

## 2018-06-10 ENCOUNTER — Other Ambulatory Visit (HOSPITAL_COMMUNITY): Payer: Medicare HMO

## 2018-06-11 ENCOUNTER — Encounter: Payer: Self-pay | Admitting: Cardiovascular Disease

## 2018-06-15 ENCOUNTER — Other Ambulatory Visit: Payer: Self-pay | Admitting: Family Medicine

## 2018-06-15 MED ORDER — CARVEDILOL 6.25 MG PO TABS: 6.2500 mg | ORAL_TABLET | Freq: Two times a day (BID) | ORAL | 0 refills | Status: DC

## 2018-06-15 NOTE — Telephone Encounter (Signed)
Copied from [place] [id number]. Topic: Quick Communication - Rx Refill/Question >> Jun 15, 2018  2:35 PM Wynetta Emery, [place] C wrote: Medication:   carvedilol (COREG) 6.25 MG tablet   Has the patient contacted their pharmacy? Yes   (Agent: If no, request that the patient contact the pharmacy for the refill.) (Agent: If yes, when and what did the pharmacy advise?)  Preferred Pharmacy (with phone number or street name): [id number] [place], [place] [phone] (Phone) [phone] (Fax)    Agent: Please be advised that RX refills may take up to 3 business days. We ask that you follow-up with your pharmacy.

## 2018-06-15 NOTE — Telephone Encounter (Signed)
See request °

## 2018-06-16 ENCOUNTER — Other Ambulatory Visit: Payer: Self-pay | Admitting: Family Medicine

## 2018-06-21 ENCOUNTER — Telehealth: Payer: Self-pay | Admitting: *Deleted

## 2018-06-21 NOTE — Telephone Encounter (Signed)
Called pt to discuss re-scheduling CRT-D implant. Pt informs me that she moved back to [place] last week. States she couldn't take it here, allergies were to bad, she was too sick all the time. Pt advised to find cardiologist soon to establish care and discuss importance of implant, sooner rather than later. Patient verbalized understanding and agreeable to plan.   Forwarding to Dr. Curt Bears for his FYI. Will forward to general cardiologist team for their [place].

## 2018-07-08 ENCOUNTER — Ambulatory Visit: Payer: Self-pay

## 2018-07-11 ENCOUNTER — Other Ambulatory Visit: Payer: Self-pay | Admitting: Family Medicine

## 2018-07-22 ENCOUNTER — Ambulatory Visit
Payer: Medicare HMO | Attending: Student in an Organized Health Care Education/Training Program | Admitting: Student in an Organized Health Care Education/Training Program

## 2018-07-22 ENCOUNTER — Other Ambulatory Visit: Payer: Self-pay

## 2018-07-23 ENCOUNTER — Encounter: Payer: Self-pay | Admitting: Student in an Organized Health Care Education/Training Program

## 2018-08-10 ENCOUNTER — Other Ambulatory Visit: Payer: Self-pay | Admitting: Family Medicine

## 2018-08-16 ENCOUNTER — Telehealth (HOSPITAL_COMMUNITY): Payer: Self-pay

## 2018-08-16 NOTE — Telephone Encounter (Signed)
New message  Just an FYI. We have made several attempts to contact this patient including sending a letter to schedule or reschedule their echocardiogram. We will be removing the patient from the echo [place].     7.27.20 @ 11:18am lm on home vm -Avis Mcmahill  7.9.20 @ 12:07pm lm on home vm - Heman Que  6.8.20 @ 2:52pm lm on home vm - [place]--- 5/22 mail reminder letter Jari Sportsman

## 2018-09-04 ENCOUNTER — Other Ambulatory Visit: Payer: Self-pay | Admitting: Family Medicine

## 2018-09-06 NOTE — Telephone Encounter (Signed)
Dr Rogers Blocker patient.

## 2018-10-20 ENCOUNTER — Other Ambulatory Visit: Payer: Self-pay | Admitting: Family Medicine

## 2018-10-20 DIAGNOSIS — R05 Cough: Secondary | ICD-10-CM

## 2018-10-20 DIAGNOSIS — R059 Cough, unspecified: Secondary | ICD-10-CM

## 2018-10-25 ENCOUNTER — Other Ambulatory Visit: Payer: Self-pay | Admitting: Family Medicine

## 2018-10-25 ENCOUNTER — Telehealth: Payer: Self-pay | Admitting: Family Medicine

## 2018-10-25 MED ORDER — CALCIUM 1000 + D 1000-800 MG-UNIT PO TABS: 1.0000 | ORAL_TABLET | Freq: Two times a day (BID) | ORAL | 0 refills | Status: AC

## 2018-10-25 NOTE — Telephone Encounter (Signed)
Let her know I filled her calcium prescription for only one month since requested in [place]. Is she moved back there for good?   Orma Flaming, MD [place]

## 2018-10-25 NOTE — Telephone Encounter (Signed)
Spoke to patient and advised that prescription has been refilled for 1 month supply only.  Patient states that she did move back to [facility] and that she does have new PCP there.  Advised her to follow up w/her PCP in [facility] for any further refills going forward.  Patient verbalized understanding.

## 2018-11-07 ENCOUNTER — Other Ambulatory Visit: Payer: Self-pay | Admitting: Family Medicine

## 2018-11-28 IMAGING — DX DG CHEST 2V
2 series · 2 of 2 positions shown · non-contrast
Comparison: None.

CLINICAL DATA: 67-year-old female with a history of nonproductive
cough for 3 weeks

EXAM:
CHEST - 2 VIEW

[chest pa]
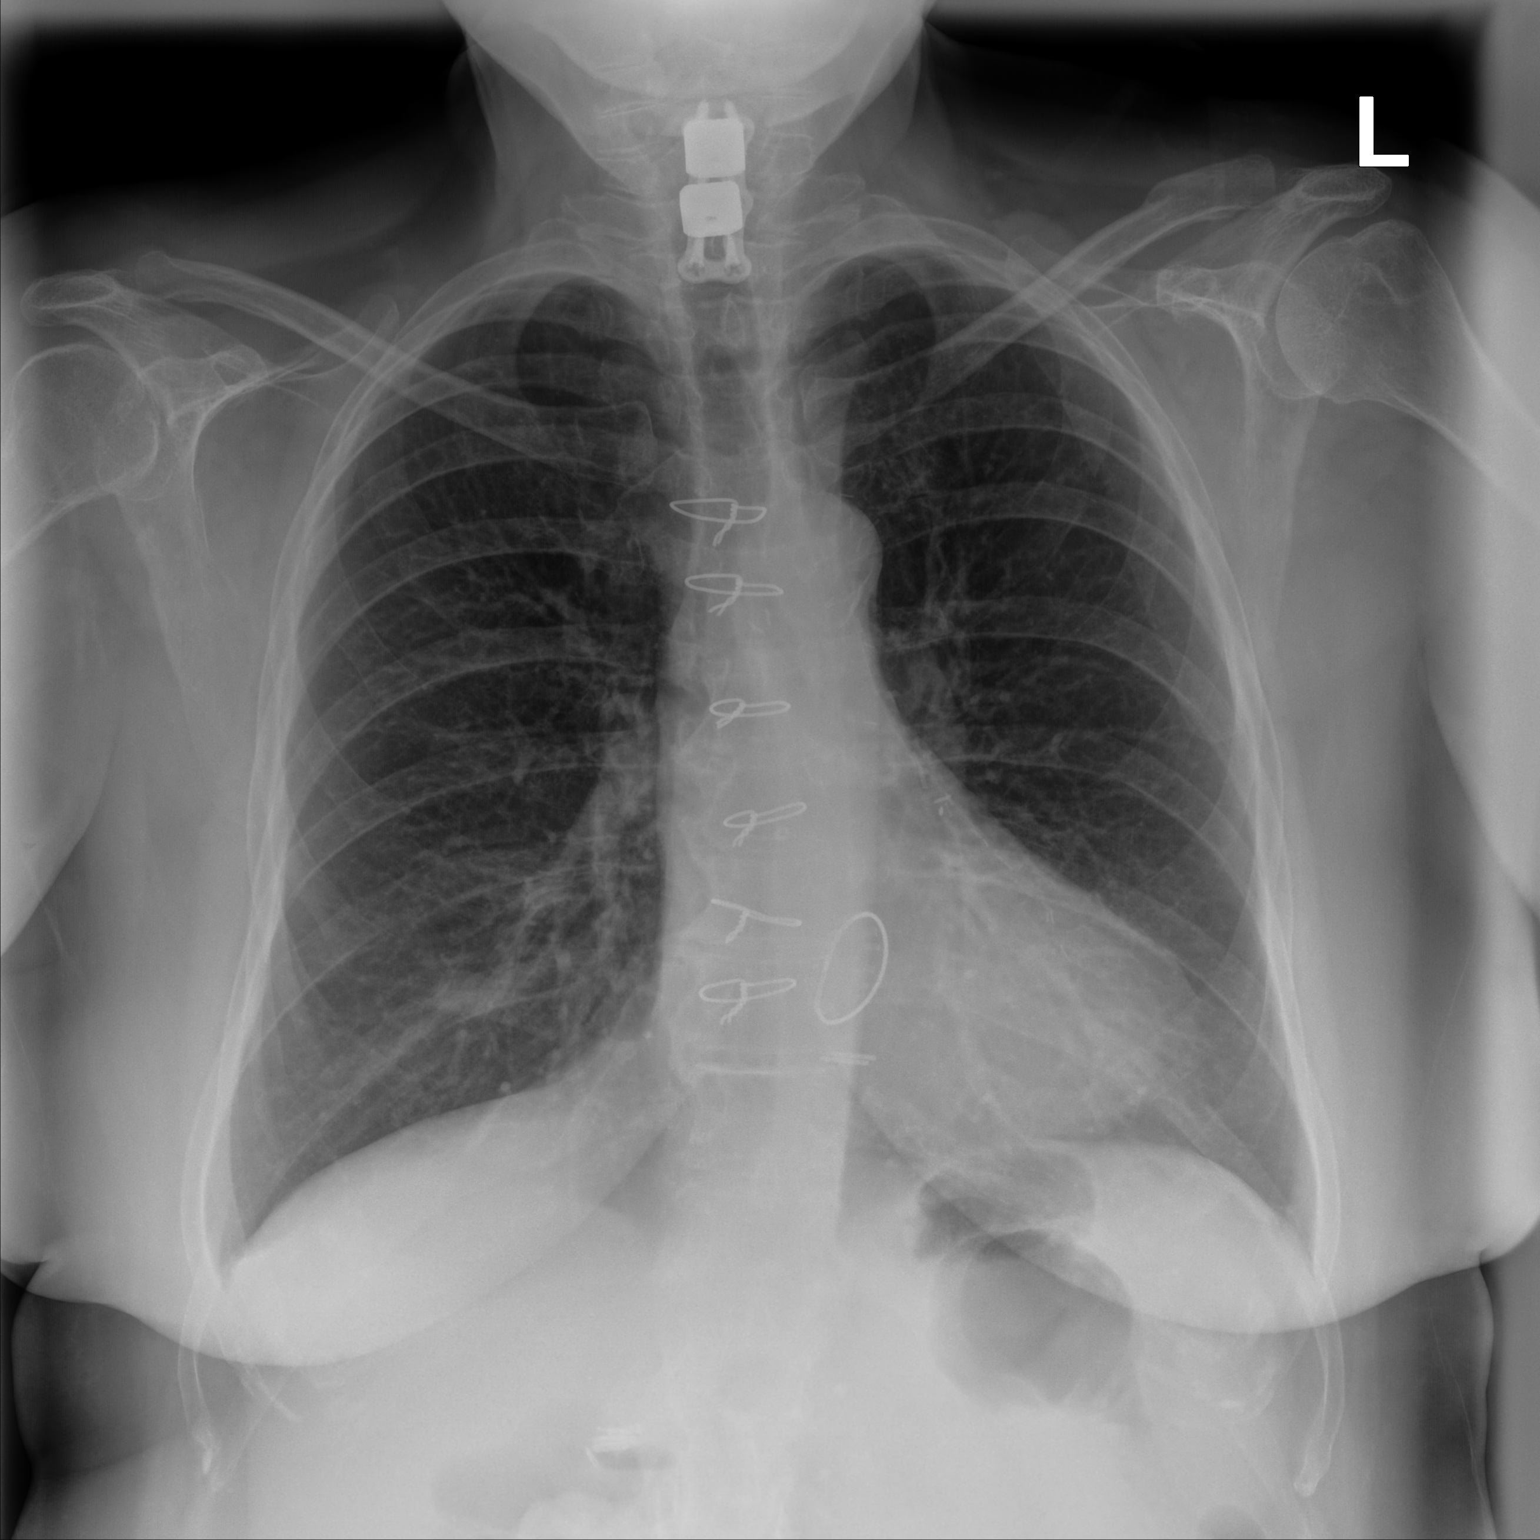

[chest lat]
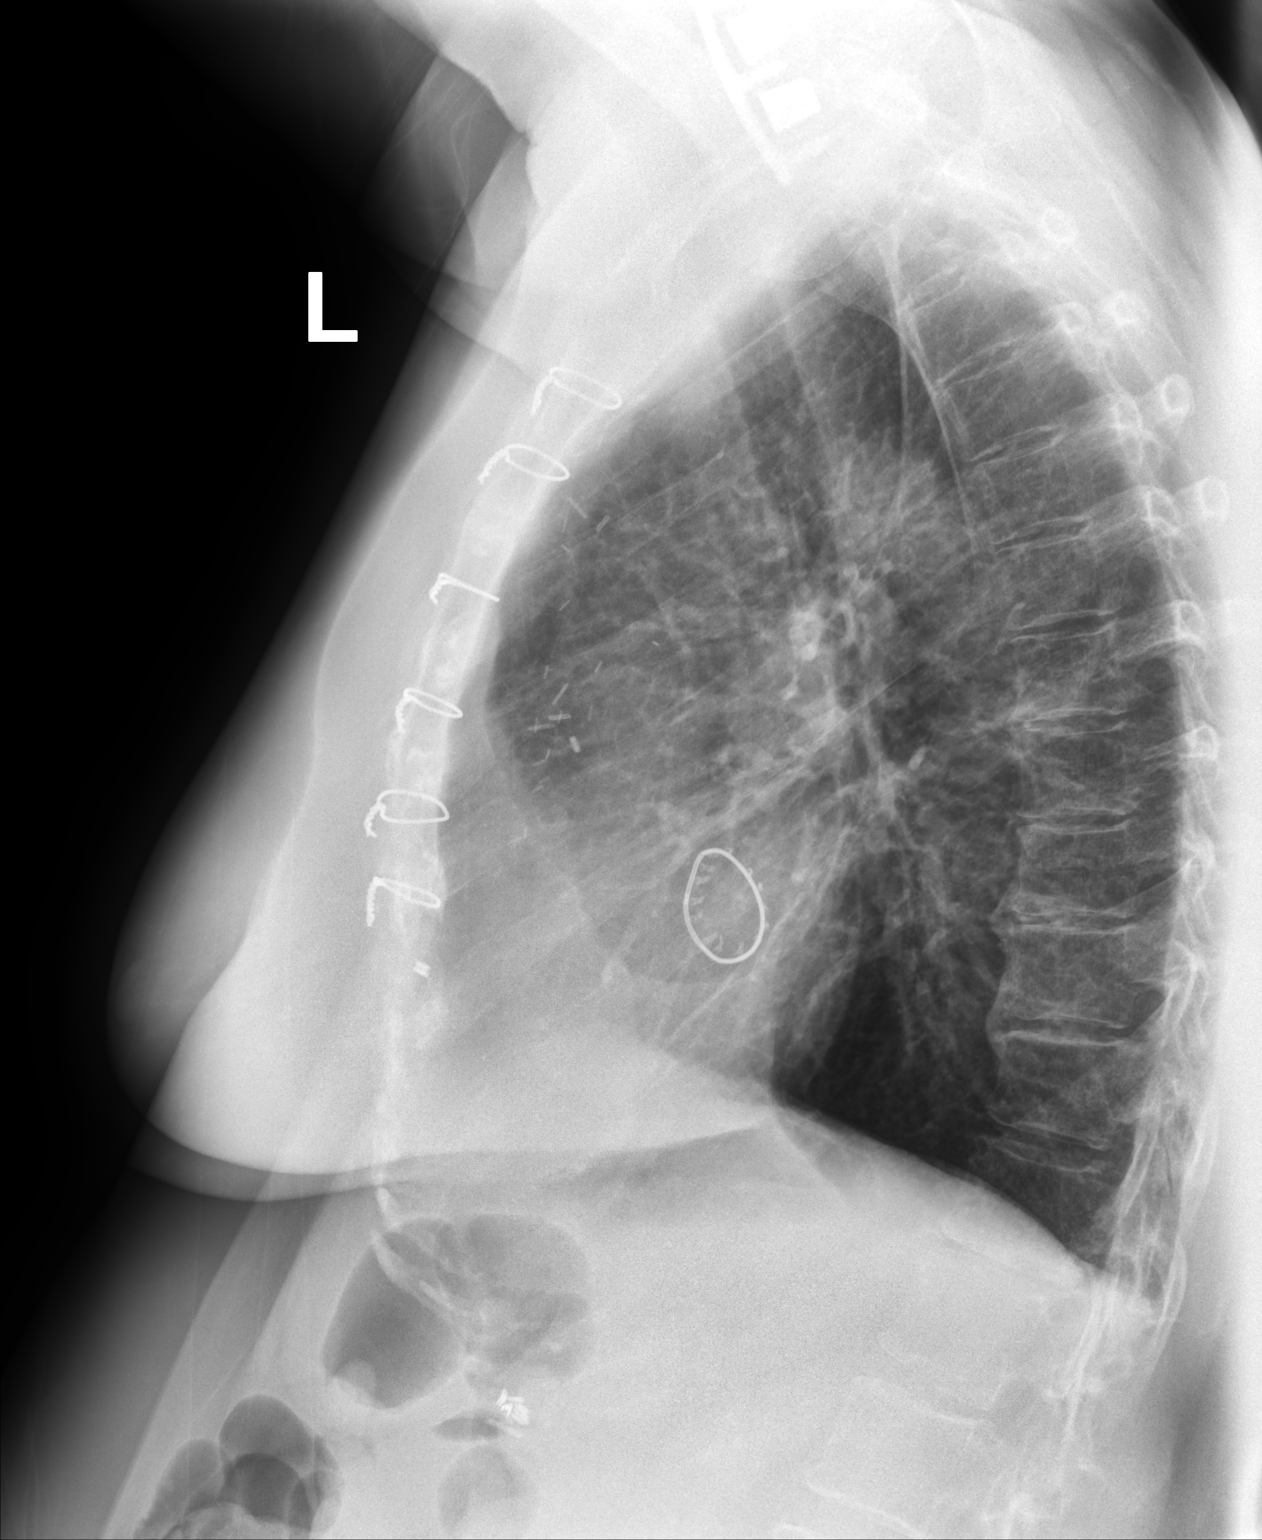

[2 of 2 positions shown; findings below may reference images not displayed]

FINDINGS: Cardiomediastinal silhouette within normal limits. Surgical changes
of median sternotomy and mitral annuloplasty.

No pneumothorax. No pleural effusion. No confluent airspace disease.

Degenerative changes of the thoracic spine.
IMPRESSION: Negative for acute cardiopulmonary disease

Surgical changes of median sternotomy and mitral annuloplasty

## 2018-12-06 ENCOUNTER — Telehealth: Payer: Self-pay | Admitting: Orthopedic Surgery

## 2018-12-06 NOTE — Telephone Encounter (Deleted)
Noted. Patient by same name. Not correct patient. Reported as per protocol.

## 2018-12-06 NOTE — Telephone Encounter (Signed)
Erroneous encounter

## 2018-12-06 NOTE — Telephone Encounter (Signed)
I think this message is in incorrect patients chart. He did surgery on patient with same name.

## 2018-12-06 NOTE — Telephone Encounter (Signed)
Sharyn Lull from [place] at [facility] [phone] - called to request orders for home therapy:  2 times per week for 1 week 1 time per week for 1 week

## 2018-12-06 NOTE — Telephone Encounter (Signed)
Unable to get [facility] at number given.

## 2019-01-01 ENCOUNTER — Other Ambulatory Visit: Payer: Self-pay | Admitting: Family Medicine

## 2019-01-03 ENCOUNTER — Telehealth: Payer: Self-pay | Admitting: Family Medicine

## 2019-01-03 NOTE — Telephone Encounter (Signed)
Please let her know she is way overdue for diabetes/chronic problem follow up. Hope she is doing well. Merry christmas,  Dr. Rogers Blocker

## 2019-01-03 NOTE — Telephone Encounter (Signed)
Patient has moved to [place].  Spoke to her several months ago and she did state that she had established with a new PCP there.

## 2019-01-04 ENCOUNTER — Other Ambulatory Visit: Payer: Self-pay | Admitting: Family Medicine

## 2019-01-06 ENCOUNTER — Other Ambulatory Visit: Payer: Self-pay | Admitting: Family Medicine

## 2019-01-22 IMAGING — CR DG HIP (WITH OR WITHOUT PELVIS) 2-3V*R*
3 series · 3 of 3 positions shown · non-contrast
Comparison: None.

CLINICAL DATA: RIGHT hip pain radiating to knee, fell last night.

EXAM:
DG HIP (WITH OR WITHOUT PELVIS) 2-3V RIGHT

[t pelvis ap]
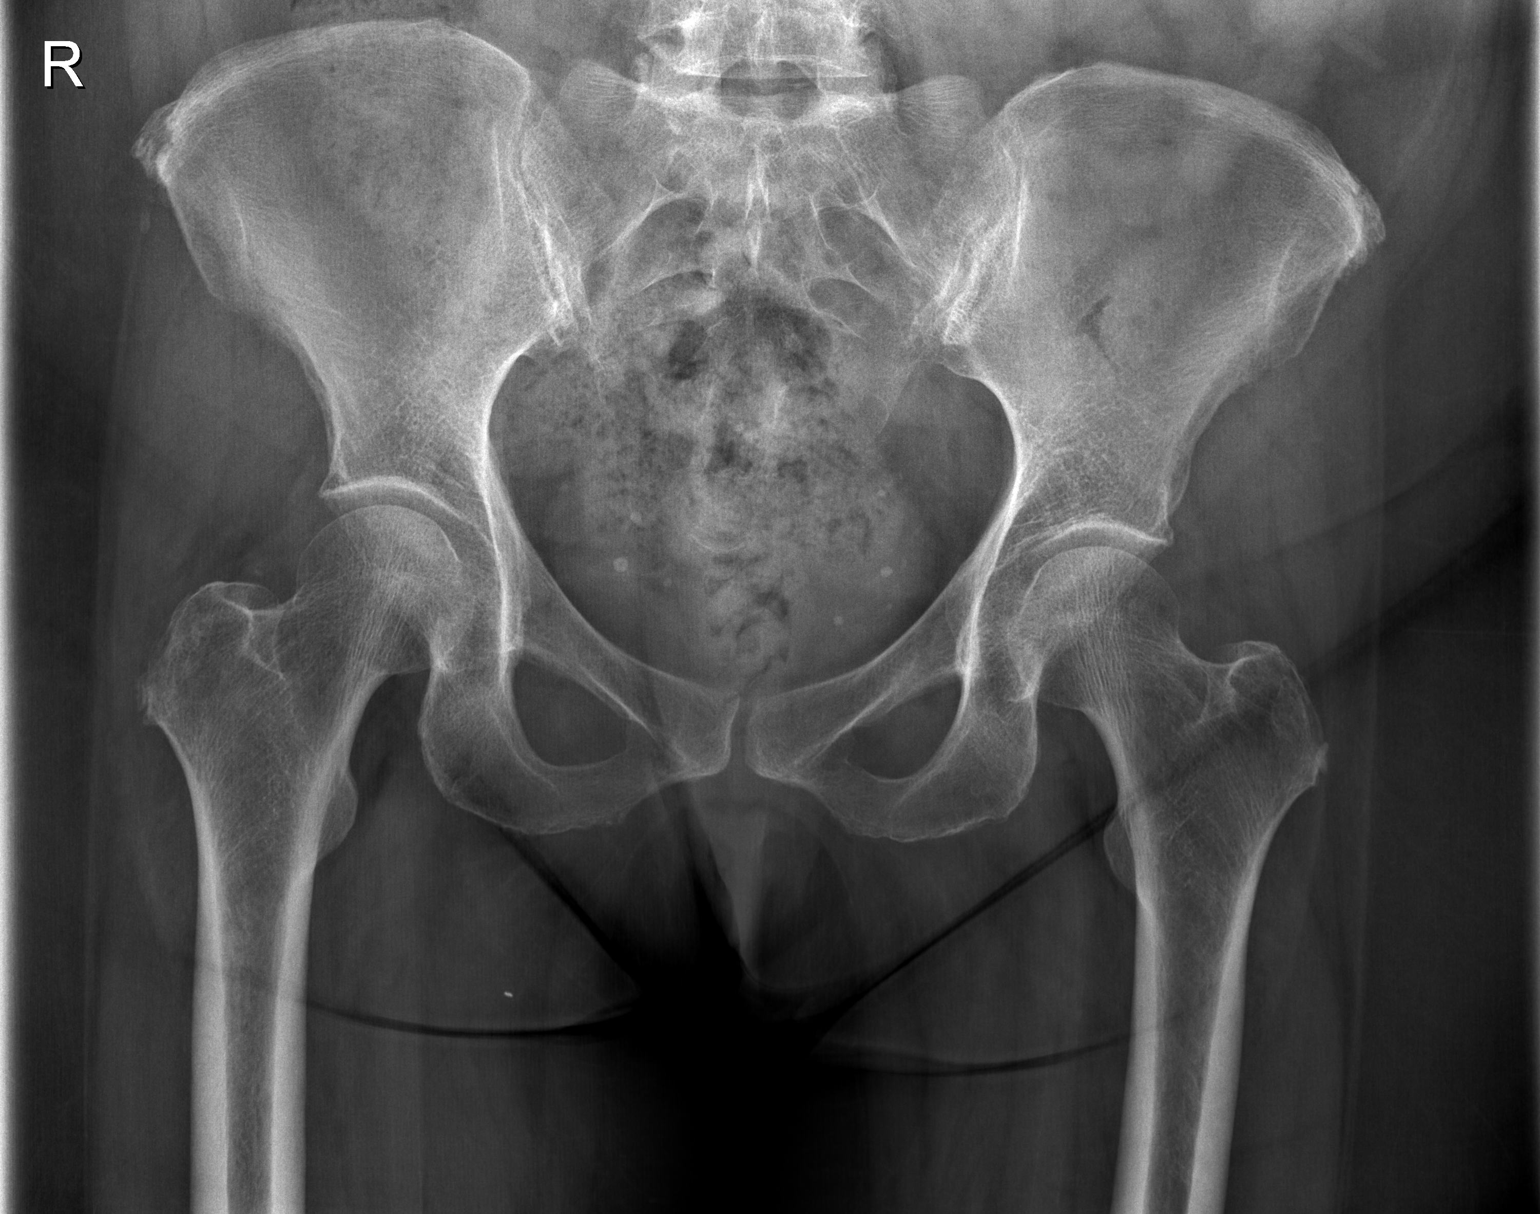

[t hip ap right]
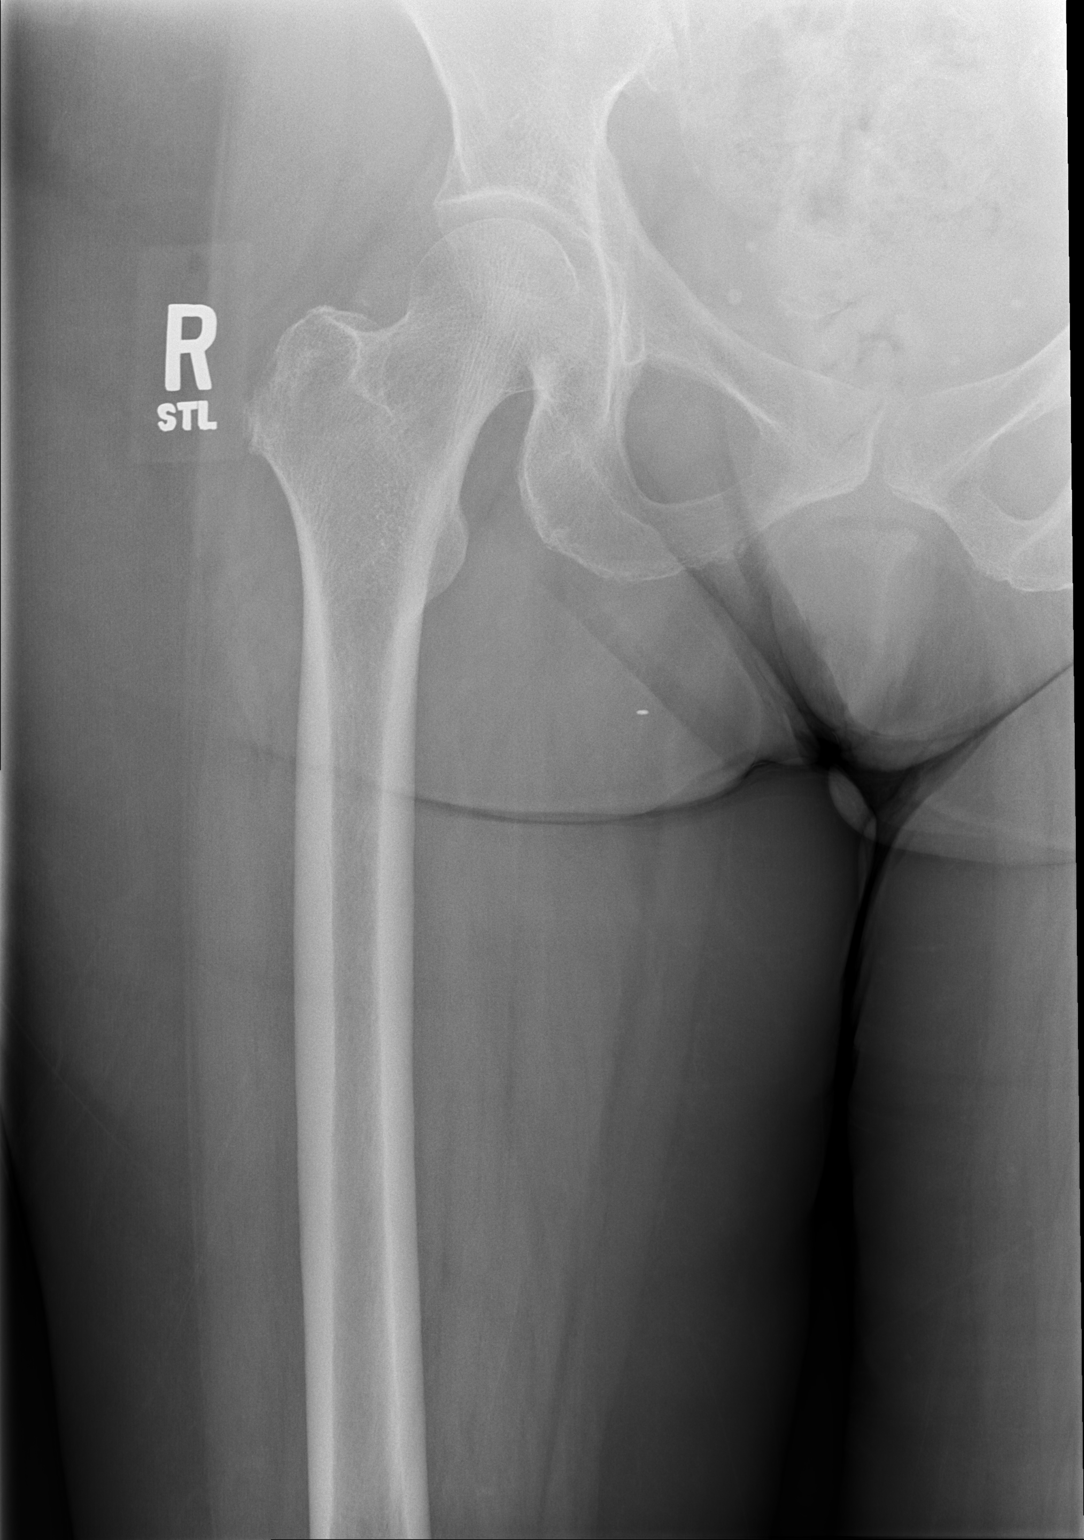

[t hip frog leg right]
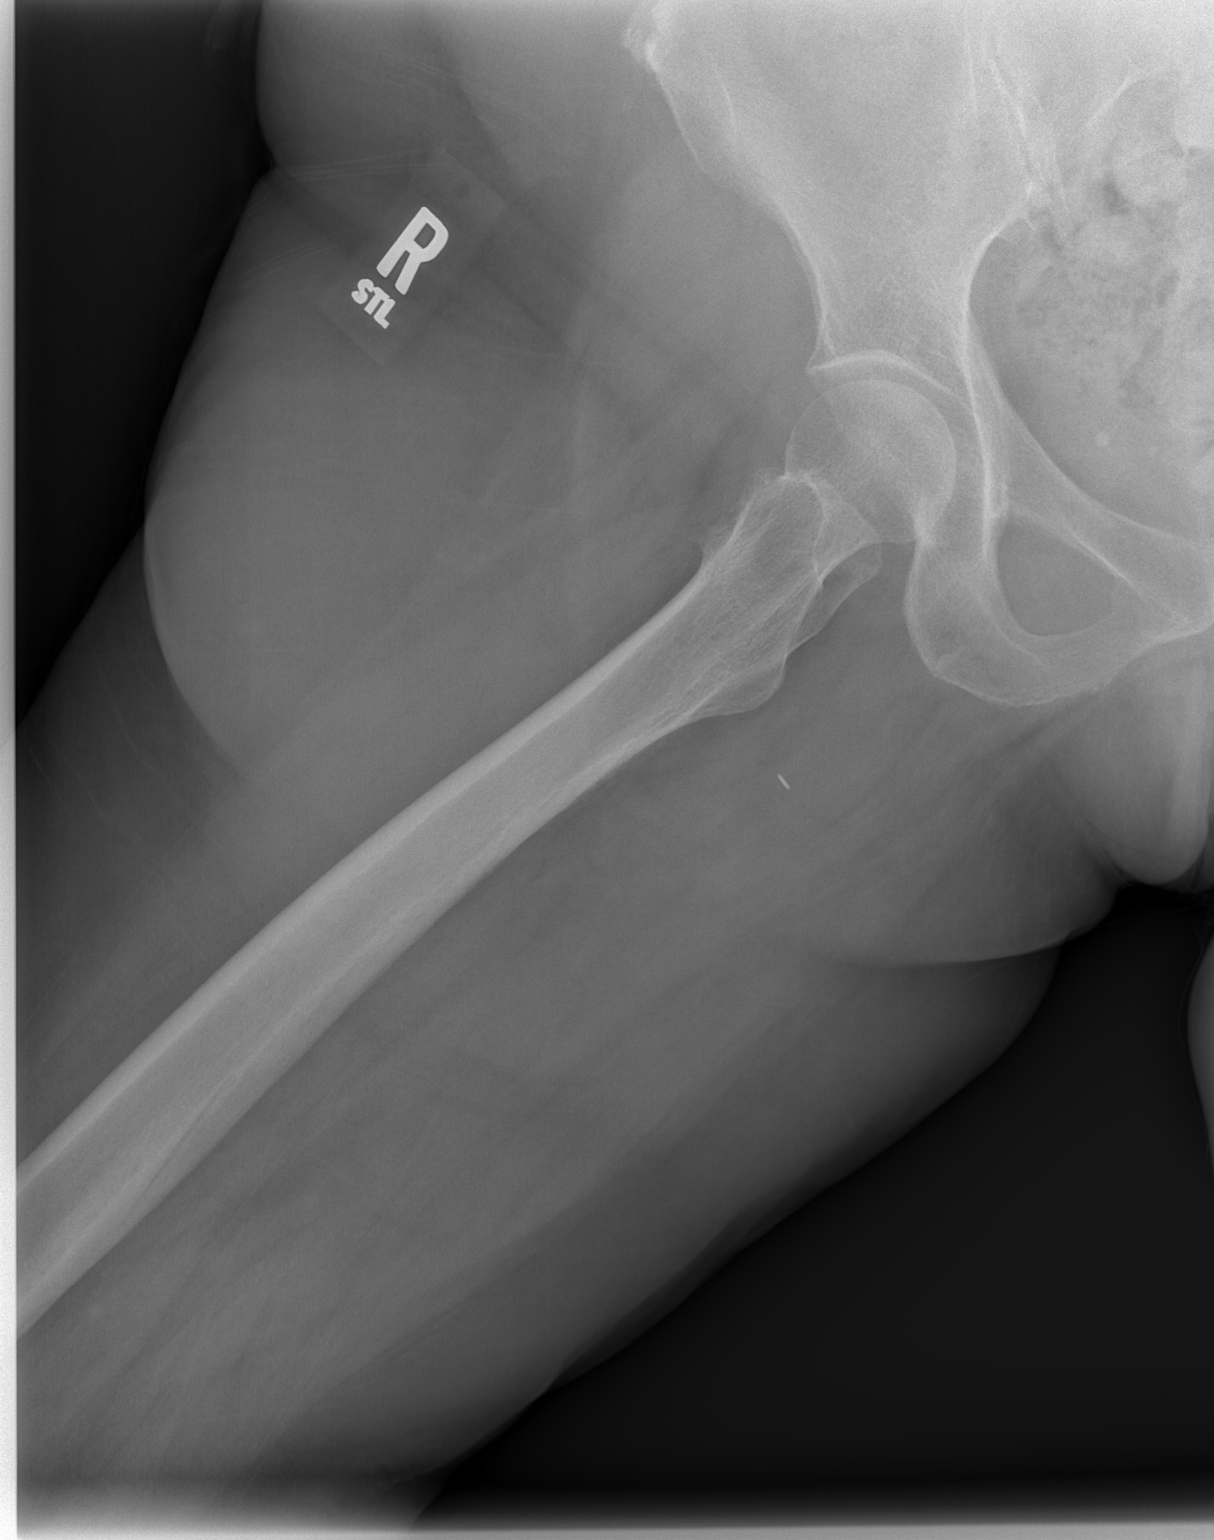

[3 of 3 positions shown; findings below may reference images not displayed]

FINDINGS: There is no evidence of hip fracture or dislocation. There is no
evidence of arthropathy or other focal bone abnormality. Phleboliths
project in the pelvis. Tiny metallic clip projects RIGHT inguinal
soft tissues.
IMPRESSION: Negative.

## 2019-03-04 ENCOUNTER — Other Ambulatory Visit: Payer: Self-pay | Admitting: Family Medicine

## 2019-04-14 ENCOUNTER — Other Ambulatory Visit: Payer: Self-pay | Admitting: Family Medicine

## 2019-04-17 ENCOUNTER — Other Ambulatory Visit: Payer: Self-pay | Admitting: Family Medicine
